# Patient Record
Sex: Male | Born: 1960
Health system: Southern US, Community
[De-identification: ages and names within clinical notes are randomized; demographics above are authoritative.]

## PROBLEM LIST (undated history)

## (undated) DIAGNOSIS — N200 Calculus of kidney: Secondary | ICD-10-CM

## (undated) DIAGNOSIS — K219 Gastro-esophageal reflux disease without esophagitis: Secondary | ICD-10-CM

## (undated) DIAGNOSIS — C61 Malignant neoplasm of prostate: Secondary | ICD-10-CM

## (undated) DIAGNOSIS — R351 Nocturia: Secondary | ICD-10-CM

## (undated) DIAGNOSIS — Z87442 Personal history of urinary calculi: Secondary | ICD-10-CM

## (undated) DIAGNOSIS — Z973 Presence of spectacles and contact lenses: Secondary | ICD-10-CM

## (undated) DIAGNOSIS — R32 Unspecified urinary incontinence: Secondary | ICD-10-CM

## (undated) HISTORY — PX: SHOULDER ARTHROSCOPY: SHX128

## (undated) HISTORY — PX: EXTRACORPOREAL SHOCK WAVE LITHOTRIPSY: SHX1557

---

## 2011-04-19 ENCOUNTER — Emergency Department
Admit: 2011-04-19 | Discharge: 2011-04-19 | Disposition: A | Discharge status: Home / Self Care | Payer: BC Managed Care – PPO | Attending: Emergency Medicine | Admitting: Emergency Medicine

## 2011-04-19 ENCOUNTER — Emergency Department (INDEPENDENT_AMBULATORY_CARE_PROVIDER_SITE_OTHER)
Admission: EM | Admit: 2011-04-19 | Discharge: 2011-04-19 | Disposition: A | Discharge status: Home Health | Payer: BC Managed Care – PPO | Source: Home / Self Care | Attending: Emergency Medicine | Admitting: Emergency Medicine

## 2011-04-19 ENCOUNTER — Encounter: Payer: Self-pay | Admitting: Emergency Medicine

## 2011-04-19 DIAGNOSIS — M545 Low back pain, unspecified: Secondary | ICD-10-CM

## 2011-04-19 DIAGNOSIS — N200 Calculus of kidney: Secondary | ICD-10-CM

## 2011-04-19 LAB — POCT URINALYSIS DIP (MANUAL ENTRY)
Nitrite, UA: NEGATIVE
Protein Ur, POC: 30
pH, UA: 6 (ref 5–8)

## 2011-04-19 MED ORDER — CIPROFLOXACIN HCL 500 MG PO TABS: 500.0000 mg | ORAL_TABLET | Freq: Two times a day (BID) | ORAL | Status: DC

## 2011-04-19 NOTE — ED Notes (Signed)
Kidney stone x 1 week

## 2011-04-19 NOTE — ED Provider Notes (Addendum)
History     CSN: [id number]  Arrival date & time 04/19/11  4098   None     Chief Complaint  Patient presents with  . Nephrolithiasis    (Consider location/radiation/quality/duration/timing/severity/associated sxs/prior treatment) HPI Adam Ramos is a 51 y.o. male who presents today with abd pain / back pain symptoms for 1 week, intermittant. He has a history of kidney stones for the last one was more than 10 years ago. In the past he has had acoustic lithotripsy as well as a removal of stones. His symptoms started about a week ago but he woke up this morning with much more severe pain. It is mostly located on the right side and feels sharp, 8 /10 and similar to the pain that he's had in the past for previous stones.  Nausea and the feeling of being uncomfortable. No dysuria No frequency No urgency No hematuria No penile discharge No fever/chills No fatigue    History reviewed. No pertinent past medical history.  Past Surgical History  Procedure Date  . Kidney stones removed   . Sholder surgery     No family history on file. No family history of kidney stones or other significant abdominal issues.  History  Substance Use Topics  . Smoking status: Never Smoker   . Smokeless tobacco: Not on file  . Alcohol Use: No      Review of Systems  All other systems reviewed and are negative.    Allergies  Review of patient's allergies indicates no known allergies.  Home Medications   Current Outpatient Rx  Name Route Sig Dispense Refill  . CIPROFLOXACIN HCL 500 MG PO TABS Oral Take 1 tablet (500 mg total) by mouth 2 (two) times daily. 10 tablet 0    BP 129/87  Pulse 59  Temp(Src) 98 F (36.7 C) (Oral)  Resp 16  Ht 6\' 1"  (1.854 m)  Wt 235 lb (106.595 kg)  BMI 31.00 kg/m2  Physical Exam  Nursing note and vitals reviewed. Constitutional: He is oriented to person, place, and time. He appears well-developed and well-nourished.  Non-toxic appearance. He does not have a  sickly appearance. He does not appear ill. He appears distressed (appears uncomfortable when sitting down).  HENT:  Head: Normocephalic and atraumatic.  Eyes: No scleral icterus.  Neck: Neck supple.  Cardiovascular: Normal rate, regular rhythm and normal heart sounds.   Pulmonary/Chest: Effort normal and breath sounds normal. No respiratory distress. He has no decreased breath sounds. He has no wheezes. He has no rhonchi.  Abdominal: Soft. Normal appearance. There is no tenderness. There is CVA tenderness (Right-sided). There is no rigidity, no rebound, no guarding, no tenderness at McBurney's point and negative Murphy's sign.  Neurological: He is alert and oriented to person, place, and time. GCS eye subscore is 4. GCS verbal subscore is 5. GCS motor subscore is 6.       Distal neuro status intact in terms of normal distal sensation and strength.  Normal gait.  Skin: Skin is warm and dry. No rash noted.  Psychiatric: He has a normal mood and affect. His speech is normal. He is agitated (secondary to pain).    ED Course  Procedures (including critical care time)   Labs Reviewed  POCT UA - MICROSCOPIC ONLY  POCT URINALYSIS DIP (MANUAL ENTRY)  URINE CULTURE   Ct Abdomen Pelvis Wo Contrast  04/19/2011  *RADIOLOGY REPORT*  Clinical Data: Right-sided back pain, history of kidney stones, prior lithotripsy  CT ABDOMEN AND PELVIS WITHOUT CONTRAST  Technique:  Multidetector CT imaging of the abdomen and pelvis was performed following the standard protocol without intravenous contrast.  Comparison: None.  Findings: Mild subpleural reticulation/atelectasis at the lung bases.  Unenhanced liver, spleen, pancreas, and adrenal glands within normal limits.  Gallbladder is unremarkable.  No intrahepatic or extrahepatic ductal dilatation.  3 mm nonobstructing left upper pole calculus (series 2/image 28). No right renal calculi.  Mild right hydronephrosis.  No evidence of bowel obstruction.  Normal appendix.  No  evidence of abdominal aortic aneurysm.  No abdominopelvic ascites.  Mild stranding in the jejunal mesentery, nonspecific.  No suspicious abdominopelvic lymphadenopathy.  Mild proximal right ureteronephrosis with a 6 mm mid ureteral calculus (series 2/image 48).  No left ureteral or bladder calculi.  Prostate is unremarkable.  Mild degenerative changes of the visualized thoracolumbar spine.  IMPRESSION: 6 mm mid right ureteral calculus with mild proximal hydroureteronephrosis.  3 mm nonobstructing left upper pole calculus.  No left hydronephrosis.  Original Report Authenticated By: Charline Bills, M.D.     1. Pain in lower back   2. Kidney stone       MDM   A urinalysis and urine culture were performed which demonstrates moderate blood, some leukocyte esterase and protein.  A urine culture is sent to the lab.  Due to the patient's symptoms and examination, I feel that he likely has a kidney stone probably on the right side. Therefore we will obtain an CT of the abdomen and pelvis without contrast.  It is performed by [facility] here in clinic and it is read by radiology as above.  I let him know that the 3 mm nonobstructing one on the left side is likely causing no problems. However the 6 mm one on the right side is the cause of his pain and maybe partially obstructing and because of this I would like him to followup with a urologist today.  I was planning on giving him prescription for Cipro and Vicodin, however we get him set up for a urology appointment in about an hour and half and so I will defer to the urologist for further treatment of his kidney stone. I've advised the patient of these plans and he has agreed and will drive to [facility] to followup now with the urologist. If pain becomes unbearable in the next hour or 2, he was advised to go to the urologist earlier as per their recommendations.    Marlaine Hind, MD 04/19/11 1057     Marlaine Hind, MD 04/24/11  1706  Marlaine Hind, MD 04/24/11 [phone]

## 2011-04-21 LAB — URINE CULTURE
Colony Count: NO GROWTH
Organism ID, Bacteria: NO GROWTH

## 2011-05-03 ENCOUNTER — Other Ambulatory Visit: Payer: Self-pay | Admitting: Urology

## 2011-05-08 ENCOUNTER — Encounter (HOSPITAL_COMMUNITY): Payer: Self-pay | Admitting: Pharmacy Technician

## 2011-05-13 ENCOUNTER — Encounter (HOSPITAL_COMMUNITY): Payer: Self-pay | Admitting: *Deleted

## 2011-05-13 NOTE — Progress Notes (Signed)
Reminded no aspirin products 3 days prior to ESWL and to take laxative 4/7/13pm

## 2011-05-19 MED ORDER — CIPROFLOXACIN HCL 500 MG PO TABS
500.0000 mg | ORAL_TABLET | ORAL | Status: AC
  Administered 2011-05-20: 500 mg via ORAL
  Filled 2011-05-19: qty 1

## 2011-05-20 ENCOUNTER — Ambulatory Visit (HOSPITAL_COMMUNITY): Payer: BC Managed Care – PPO

## 2011-05-20 ENCOUNTER — Encounter (HOSPITAL_COMMUNITY)
Admission: RE | Disposition: A | Discharge status: Home / Self Care | Payer: Self-pay | Source: Ambulatory Visit | Attending: Urology

## 2011-05-20 ENCOUNTER — Encounter (HOSPITAL_COMMUNITY): Payer: Self-pay | Admitting: *Deleted

## 2011-05-20 ENCOUNTER — Ambulatory Visit (HOSPITAL_COMMUNITY)
Admission: RE | Admit: 2011-05-20 | Discharge: 2011-05-20 | Disposition: A | Discharge status: Home / Self Care | Payer: BC Managed Care – PPO | Source: Ambulatory Visit | Attending: Urology | Admitting: Urology

## 2011-05-20 DIAGNOSIS — N201 Calculus of ureter: Secondary | ICD-10-CM

## 2011-05-20 SURGERY — LITHOTRIPSY, ESWL
Anesthesia: LOCAL | Laterality: Right

## 2011-05-20 MED ORDER — DEXTROSE-NACL 5-0.45 % IV SOLN
INTRAVENOUS | Status: DC
  Administered 2011-05-20: 1000 mL via INTRAVENOUS

## 2011-05-20 MED ORDER — DIPHENHYDRAMINE HCL 25 MG PO CAPS
ORAL_CAPSULE | ORAL | Status: AC
  Filled 2011-05-20: qty 1

## 2011-05-20 MED ORDER — CIPROFLOXACIN HCL 500 MG PO TABS
ORAL_TABLET | ORAL | Status: AC
  Filled 2011-05-20: qty 1

## 2011-05-20 MED ORDER — DIAZEPAM 5 MG PO TABS
10.0000 mg | ORAL_TABLET | ORAL | Status: AC
  Administered 2011-05-20: 10 mg via ORAL

## 2011-05-20 MED ORDER — DIPHENHYDRAMINE HCL 25 MG PO CAPS
25.0000 mg | ORAL_CAPSULE | ORAL | Status: AC
  Administered 2011-05-20: 25 mg via ORAL

## 2011-05-20 MED ORDER — DIAZEPAM 5 MG PO TABS
ORAL_TABLET | ORAL | Status: AC
  Filled 2011-05-20: qty 2

## 2011-05-20 NOTE — Discharge Instructions (Signed)
DISCHARGE INSTRUCTIONS FOR SHOCKWAVE LITHOTRIPSY ° °MEDICATIONS:  ° °1. DO NOT RESUME YOUR ASPIRIN, or any other medicines like ibuprofen, motrin, excedrin, advil, aleve, vitamin E, fish oil as these can all cause bleeding x 7 days. ° °2. Resume all your other meds from. ° °ACTIVITY °1. No strenuous activity x 1week °2. No driving while on narcotic pain medications °3. Drink plenty of water °4. Continue to walk at home - you can still get blood clots when you are at home, so keep active, but don't over do it. °5. May return to work in 1-3 days. ° °BATHING °You can shower or take a bath. ° ° °SIGNS/SYMPTOMS TO CALL: °1. Please call us if you have a fever greater than 101.5, uncontrolled  °nausea/vomiting, uncontrolled pain, dizziness, unable to urinate, chest pain, shortness of breath, leg swelling, leg pain, redness around wound, drainage from wound, or any other concerns or questions. ° °You can reach us at [phone]. ° ° ° °

## 2011-05-20 NOTE — Brief Op Note (Signed)
05/20/2011  4:16 PM  PATIENT:  Luz C [place]  51 y.o. male  PRE-OPERATIVE DIAGNOSIS:  Right Ureter Stone  POST-OPERATIVE DIAGNOSIS: Right ureter stone  PROCEDURE:  Procedure(s) (LRB): EXTRACORPOREAL SHOCK WAVE LITHOTRIPSY (ESWL) (Right)  SURGEON:  Surgeon(s) and Role:    * Milford Cage, MD - Primary  PHYSICIAN ASSISTANT:   ASSISTANTS: none   ANESTHESIA:   IV sedation  EBL:     BLOOD ADMINISTERED:none  DRAINS: none   LOCAL MEDICATIONS USED:  NONE  SPECIMEN:  No Specimen  DISPOSITION OF SPECIMEN:  N/A  COUNTS:  YES  TOURNIQUET:  * No tourniquets in log *  DICTATION: .Note written in paper chart  PLAN OF CARE: Discharge to home after PACU  PATIENT DISPOSITION:  PACU - hemodynamically stable.   Delay start of Pharmacological VTE agent (>24hrs) due to surgical blood loss or risk of bleeding: yes

## 2011-05-20 NOTE — H&P (Signed)
Urology History and Physical Exam  CC: Right ureter stone.  HPI:       51 year old male presents for SWL of his right ureter stone. Patient presented with a right ureter stone measuring 6 mm. He failed a trial of passage. KUB 05/02/11 shows the stone is located at the level of L4. We have discussed risks, benefits, alternatives, and likelihood of achieving his goals. His stone has moved more distally today over the sacrum, but it is still able to be visualized on fluoroscopy today.    PMH: GERD  PSH: Past Surgical History  Procedure Date  . Kidney stones removed   . Sholder surgery     Allergies: No Known Allergies  Medications: No prescriptions prior to admission  Aspirin 81 mg Dilaudid Ibuprofen Tamsulosin  Social History: History   Social History  . Marital Status: Single    Spouse Name: N/A    Number of Children: N/A  . Years of Education: N/A   Occupational History  . Not on file.   Social History Main Topics  . Smoking status: Never Smoker   . Smokeless tobacco: Not on file  . Alcohol Use: Yes     rarely  . Drug Use: No  . Sexually Active:    Other Topics Concern  . Not on file   Social History Narrative  . No narrative on file    Family History: No family history on file.  Review of Systems: Positive: Gross hematuria. Negative: Chest pain, SOB, fever.  A further 10 point review of systems was negative except what is listed in the HPI.  Physical Exam:  General: No acute distress.  Awake. Head:  Normocephalic.  Atraumatic. ENT:  EOMI.  Mucous membranes moist Neck:  Supple.  No lymphadenopathy. CV:  S1 present. S2 present. Regular rate. Pulmonary: Equal effort bilaterally.  Clear to auscultation bilaterally. Abdomen: Soft.  Non- tender to palpation. Skin:  Normal turgor.  No visible rash. Extremity: No gross deformity of bilateral upper extremities.  No gross deformity of    bilateral lower extremities. Neurologic: Alert. Appropriate  mood.   Studies:  No results found for this basename: HGB:2,WBC:2,PLT:2 in the last 72 hours  No results found for this basename: NA:2,K:2,CL:2,CO2:2,BUN:2,CREATININE:2,CALCIUM:2,MAGNESIUM:2,GFRNONAA:2,GFRAA:2 in the last 72 hours   No results found for this basename: PT:2,INR:2,APTT:2 in the last 72 hours   No components found with this basename: ABG:2    Assessment:  Right ureter stone.  Plan: Shockwave lithotripsy of right ureter stone.

## 2014-02-10 ENCOUNTER — Other Ambulatory Visit: Payer: Self-pay | Admitting: Urology

## 2014-02-14 ENCOUNTER — Encounter (HOSPITAL_COMMUNITY): Payer: Self-pay | Admitting: *Deleted

## 2014-02-17 ENCOUNTER — Ambulatory Visit (HOSPITAL_COMMUNITY)
Admission: RE | Admit: 2014-02-17 | Discharge: 2014-02-17 | Disposition: A | Discharge status: Home / Self Care | Payer: BLUE CROSS/BLUE SHIELD | Source: Ambulatory Visit | Attending: Urology | Admitting: Urology

## 2014-02-17 ENCOUNTER — Encounter (HOSPITAL_COMMUNITY): Payer: Self-pay | Admitting: General Practice

## 2014-02-17 ENCOUNTER — Encounter (HOSPITAL_COMMUNITY)
Admission: RE | Disposition: A | Discharge status: Home / Self Care | Payer: Self-pay | Source: Ambulatory Visit | Attending: Urology

## 2014-02-17 ENCOUNTER — Ambulatory Visit (HOSPITAL_COMMUNITY): Payer: BLUE CROSS/BLUE SHIELD

## 2014-02-17 DIAGNOSIS — N201 Calculus of ureter: Secondary | ICD-10-CM | POA: Insufficient documentation

## 2014-02-17 DIAGNOSIS — Z87891 Personal history of nicotine dependence: Secondary | ICD-10-CM | POA: Diagnosis not present

## 2014-02-17 HISTORY — DX: Gastro-esophageal reflux disease without esophagitis: K21.9

## 2014-02-17 SURGERY — LITHOTRIPSY, ESWL
Anesthesia: LOCAL | Laterality: Left

## 2014-02-17 MED ORDER — DIPHENHYDRAMINE HCL 25 MG PO CAPS
25.0000 mg | ORAL_CAPSULE | ORAL | Status: AC
  Administered 2014-02-17: 25 mg via ORAL
  Filled 2014-02-17: qty 1

## 2014-02-17 MED ORDER — SODIUM CHLORIDE 0.9 % IV SOLN
INTRAVENOUS | Status: DC
  Administered 2014-02-17: 15:00:00 via INTRAVENOUS

## 2014-02-17 MED ORDER — CIPROFLOXACIN HCL 500 MG PO TABS
500.0000 mg | ORAL_TABLET | ORAL | Status: AC
  Administered 2014-02-17: 500 mg via ORAL
  Filled 2014-02-17: qty 1

## 2014-02-17 MED ORDER — DIAZEPAM 5 MG PO TABS
10.0000 mg | ORAL_TABLET | ORAL | Status: AC
  Administered 2014-02-17: 10 mg via ORAL
  Filled 2014-02-17: qty 2

## 2014-02-17 NOTE — H&P (Signed)
Reason For Visit Left ureteral stone   History of Present Illness 36M with extensive history of stones that presented yesterday with 5 days of left renal colic symptoms. KUB revealed a 15mm wide x 92mm long stone in the left proximal ureter. He presents to discuss treatment options. The patient does state that he has bilateral pain, his right side hurts posterior iliac wing which is tender to palpation. He also feels pressure in his left kidney.   Past Medical History Problems  1. History of esophageal reflux (Z87.19)  Surgical History Problems  1. History of Bladder Cystotomy With Basket Extraction Of Calculus 2. History of Lithotripsy 3. History of Lithotripsy 4. History of Shoulder Surgery Right  Current Meds 1. Hydrocodone-Acetaminophen 7.5-325 MG Oral Tablet; TAKE 1 TO 2 TABLETS EVERY 4  TO 6 HOURS AS NEEDED FOR PAIN;  Therapy: [id number] to (Evaluate:03Jan2016); Last Rx:30Dec2015 Ordered 2. Ibuprofen 200 MG Oral Tablet;  Therapy: (Recorded:08Mar2013) to Recorded 3. Promethazine HCl - 25 MG Oral Tablet; May take 0.5 to 1 tablet Q 6hrs nausea/vomiting;  Therapy: [id number] to (Last Rx:30Dec2015)  Requested for: 30Dec2015 Ordered 4. Tamsulosin HCl - 0.4 MG Oral Capsule; TAKE 1 CAPSULE Daily;  Therapy: [id number] to (Evaluate:29Jan2016)  Requested for: 85OYD7412; Last  Rx:30Dec2015 Ordered  Allergies Medication  1. No Known Drug Allergies  Family History Problems  1. Family history of Family Health Status Of Mother - Alive 2. Family history of Stroke Syndrome : Father  Social History Problems  1. Alcohol Use   occasional use 2. Caffeine Use   3-4 3. Former smoker [phone])   quit 30+ years ago 4. Marital History - Single 5. Occupation:   draftsman  Review of Systems No changes in pts bowel habits, neurological changes, or progressive lower urinary tract symptoms.    Vitals Vital Signs [Data Includes: Last 1 Day]  Recorded: 31Dec2015 11:58AM  Blood Pressure:  136 / 87 Temperature: 98 F Heart Rate: 60 Recorded: 30Dec2015 08:44AM  Weight: 230 lb  BMI Calculated: 30.35 BSA Calculated: 2.28 Blood Pressure: 143 / 87 Temperature: 98.2 F Heart Rate: 56  Physical Exam Constitutional: Well nourished and well developed . No acute distress.  Pulmonary: No respiratory distress, normal respiratory rhythm and effort and clear bilateral breath sounds.  Cardiovascular: Heart rate and rhythm are normal . No peripheral edema.  Abdomen: No right CVA tenderness and no left CVA tenderness.    Results/Data Urine [Data Includes: Last 1 Day]   [id number]  COLOR YELLOW   APPEARANCE CLEAR   SPECIFIC GRAVITY <1.005   pH 6.0   GLUCOSE NEG mg/dL  BILIRUBIN NEG   KETONE NEG mg/dL  BLOOD MOD   PROTEIN NEG mg/dL  UROBILINOGEN 0.2 mg/dL  NITRITE NEG   LEUKOCYTE ESTERASE NEG   SQUAMOUS EPITHELIAL/HPF FEW   WBC 0-2 WBC/hpf  RBC 0-2 RBC/hpf  BACTERIA NONE SEEN   CRYSTALS NONE SEEN   CASTS NONE SEEN    Urinalysis demonstrates moderate blood without evidence of microscopic hematuria. No evidence of infection.  I reviewed the patient's KUB which reveals a large calcification within the left proximal ureter. However there is area on the expected to direction of the right ureter is obscured by bowel gas but could have a stone within it. Further, the patient is having bilateral pain. As such, I felt it necessary to get a CT scan, stone protocol.   Assessment Assessed  1. Left ureteral calculus (N20.1)  Patient has a large proximal left ureteral obstructing stone.   Plan Health  Maintenance  1. UA With REFLEX; [Do Not Release]; Status:Complete;   Done: [id number] 11:47AM Nephrolithiasis  2. AU CT-STONE PROTOCOL; Status:In Progress - Specimen/Data Collected;   Done:  36OQH4765 12:00AM  Discussion/Summary We went over the treatment options for proximal ureteral stones including shockwave lithotripsy ureteroscopy, and observation. The stone is quite large and  would not likely pass in a reasonable time frame necessitated not recommended medical expulsion therapy. The patient has had shockwave lithotripsy before and prefers this modality. I did discuss with him ureteroscopy and stent placement as well. Finally I went over the risks and benefits of shockwave lithotripsy including need for additional procedures, perinephric hematoma, and pain. The patient has agreed to proceed.

## 2014-02-17 NOTE — Discharge Instructions (Signed)
See [facility] discharge instructions in chart.  

## 2014-02-17 NOTE — Op Note (Signed)
See [facility] OP note scanned into chart. 

## 2016-07-10 DIAGNOSIS — Z125 Encounter for screening for malignant neoplasm of prostate: Secondary | ICD-10-CM | POA: Diagnosis not present

## 2016-07-10 DIAGNOSIS — Z131 Encounter for screening for diabetes mellitus: Secondary | ICD-10-CM | POA: Diagnosis not present

## 2016-07-10 DIAGNOSIS — Z0000 Encounter for general adult medical examination without abnormal findings: Secondary | ICD-10-CM | POA: Diagnosis not present

## 2016-07-10 DIAGNOSIS — Z1211 Encounter for screening for malignant neoplasm of colon: Secondary | ICD-10-CM | POA: Diagnosis not present

## 2016-08-15 DIAGNOSIS — Z1211 Encounter for screening for malignant neoplasm of colon: Secondary | ICD-10-CM | POA: Diagnosis not present

## 2016-08-26 ENCOUNTER — Other Ambulatory Visit: Payer: Self-pay | Admitting: Urology

## 2016-08-26 DIAGNOSIS — R109 Unspecified abdominal pain: Secondary | ICD-10-CM | POA: Diagnosis not present

## 2016-08-26 DIAGNOSIS — R1111 Vomiting without nausea: Secondary | ICD-10-CM | POA: Diagnosis not present

## 2016-08-26 DIAGNOSIS — R8271 Bacteriuria: Secondary | ICD-10-CM | POA: Diagnosis not present

## 2016-08-26 DIAGNOSIS — N202 Calculus of kidney with calculus of ureter: Secondary | ICD-10-CM | POA: Diagnosis not present

## 2016-08-28 ENCOUNTER — Encounter (HOSPITAL_COMMUNITY): Payer: Self-pay | Admitting: *Deleted

## 2016-09-02 ENCOUNTER — Encounter (HOSPITAL_COMMUNITY): Payer: Self-pay | Admitting: General Practice

## 2016-09-02 ENCOUNTER — Ambulatory Visit (HOSPITAL_COMMUNITY)
Admission: RE | Admit: 2016-09-02 | Discharge: 2016-09-02 | Disposition: A | Discharge status: Home / Self Care | Payer: BLUE CROSS/BLUE SHIELD | Source: Ambulatory Visit | Attending: Urology | Admitting: Urology

## 2016-09-02 ENCOUNTER — Encounter (HOSPITAL_COMMUNITY)
Admission: RE | Disposition: A | Discharge status: Home / Self Care | Payer: Self-pay | Source: Ambulatory Visit | Attending: Urology

## 2016-09-02 ENCOUNTER — Ambulatory Visit (HOSPITAL_COMMUNITY): Payer: BLUE CROSS/BLUE SHIELD

## 2016-09-02 DIAGNOSIS — N132 Hydronephrosis with renal and ureteral calculous obstruction: Secondary | ICD-10-CM | POA: Diagnosis not present

## 2016-09-02 DIAGNOSIS — Z7982 Long term (current) use of aspirin: Secondary | ICD-10-CM | POA: Insufficient documentation

## 2016-09-02 DIAGNOSIS — N200 Calculus of kidney: Secondary | ICD-10-CM | POA: Diagnosis present

## 2016-09-02 DIAGNOSIS — Z01818 Encounter for other preprocedural examination: Secondary | ICD-10-CM | POA: Diagnosis not present

## 2016-09-02 DIAGNOSIS — N201 Calculus of ureter: Secondary | ICD-10-CM | POA: Diagnosis not present

## 2016-09-02 DIAGNOSIS — Z791 Long term (current) use of non-steroidal anti-inflammatories (NSAID): Secondary | ICD-10-CM | POA: Diagnosis not present

## 2016-09-02 DIAGNOSIS — Z87891 Personal history of nicotine dependence: Secondary | ICD-10-CM | POA: Insufficient documentation

## 2016-09-02 HISTORY — PX: EXTRACORPOREAL SHOCK WAVE LITHOTRIPSY: SHX1557

## 2016-09-02 HISTORY — DX: Personal history of urinary calculi: Z87.442

## 2016-09-02 SURGERY — LITHOTRIPSY, ESWL
Anesthesia: LOCAL | Laterality: Right

## 2016-09-02 MED ORDER — DIAZEPAM 5 MG PO TABS
10.0000 mg | ORAL_TABLET | ORAL | Status: AC
  Administered 2016-09-02: 10 mg via ORAL
  Filled 2016-09-02: qty 2

## 2016-09-02 MED ORDER — CIPROFLOXACIN HCL 500 MG PO TABS
500.0000 mg | ORAL_TABLET | ORAL | Status: AC
  Administered 2016-09-02: 500 mg via ORAL
  Filled 2016-09-02: qty 1

## 2016-09-02 MED ORDER — SODIUM CHLORIDE 0.9 % IV SOLN
INTRAVENOUS | Status: DC
  Administered 2016-09-02: 08:00:00 via INTRAVENOUS

## 2016-09-02 MED ORDER — DIPHENHYDRAMINE HCL 25 MG PO CAPS
25.0000 mg | ORAL_CAPSULE | ORAL | Status: AC
  Administered 2016-09-02: 25 mg via ORAL
  Filled 2016-09-02: qty 1

## 2016-09-02 NOTE — H&P (Signed)
CC: I have kidney stones.  HPI: Adam Ramos is a 56 year-old male established patient who is here for renal calculi.  First noted symptoms on July 5. Complaining of worsening colic pain radiating from the right lower back through the flank and right lower quadrant of the abdomen. Denies any changes in lower urinary tract symptoms. Also associated with nausea. Afebrile. Patient has a past urological history significant for kidney stones last seen by myself in 2017. Denies any interval stone passage.   The problem is on the right side. He first stated noticing pain on 08/15/2016. This is not his first kidney stone. He is currently having flank pain, back pain, and nausea. He denies having groin pain, vomiting, fever, and chills. He has not caught a stone in his urine strainer since his symptoms began.   He has had eswl for treatment of his stones in the past.     ALLERGIES: No Allergies    MEDICATIONS: Aspirin Ec 81 mg tablet, delayed release  Ibuprofen 200 MG Oral Tablet Oral     GU PSH: ESWL - 2016, 2013, 2013      [place] Notes: Lithotripsy, Lithotripsy, Bladder Cystotomy With Basket Extraction Of Calculus, Shoulder Surgery Right, Lithotripsy   NON-GU PSH: Remove Ureter Calculus - 2013    GU PMH: Low back pain, Lumbago - 05/08/2015 Ureteral calculus, Left ureteral calculus - 2016, Calculus of ureter, - 2015 Abdominal Pain Unspec, Right flank pain - 2015 Other microscopic hematuria, Microscopic hematuria - 2015 Renal calculus, Nephrolithiasis - 2014    NON-GU PMH: Encounter for general adult medical examination without abnormal findings, Encounter for preventive health examination - 2015 Nausea, Nausea - 2015 Hypercalciuria, Hypercalciuria - 2014 Personal history of other diseases of the digestive system, History of esophageal reflux - 2014    FAMILY HISTORY: Family Health Status Of Mother - Alive 19 - Runs In Family Stroke Syndrome - Father   SOCIAL HISTORY: Marital Status:  Single Current Smoking Status: Patient does not smoke anymore. Has not smoked since 02/12/1983.   Tobacco Use Assessment Completed: Used Tobacco in last 30 days? Social Drinker.  Drinks 3 caffeinated drinks per day.     Notes: Former smoker, Occupation:, Caffeine Use, Marital History - Single, Alcohol Use   REVIEW OF SYSTEMS:    GU Review Male:   Patient denies frequent urination, hard to postpone urination, burning/ pain with urination, get up at night to urinate, leakage of urine, stream starts and stops, trouble starting your stream, have to strain to urinate , erection problems, and penile pain.  Gastrointestinal (Upper):   Patient reports nausea and vomiting. Patient denies indigestion/ heartburn.  Gastrointestinal (Lower):   Patient denies diarrhea and constipation.  Constitutional:   Patient denies fever, night sweats, weight loss, and fatigue.  Skin:   Patient denies skin rash/ lesion and itching.  Eyes:   Patient denies double vision and blurred vision.  Ears/ Nose/ Throat:   Patient denies sore throat and sinus problems.  Hematologic/Lymphatic:   Patient denies swollen glands and easy bruising.  Cardiovascular:   Patient denies leg swelling and chest pains.  Respiratory:   Patient denies cough and shortness of breath.  Endocrine:   Patient denies excessive thirst.  Musculoskeletal:   Patient denies back pain and joint pain.  Neurological:   Patient denies headaches and dizziness.  Psychologic:   Patient denies depression and anxiety.   VITAL SIGNS:      08/26/2016 10:14 AM  Weight 235 lb / 106.59 kg  Height 73 in / 185.42 cm  BP 127/76 mmHg  Pulse 68 /min  Temperature 98.5 F / 37 C  BMI 31.0 kg/m   MULTI-SYSTEM PHYSICAL EXAMINATION:    Constitutional: Well-nourished. No physical deformities. Normally developed. Good grooming.  Respiratory: No labored breathing, no use of accessory muscles. CTA.  Cardiovascular: Normal temperature, normal extremity pulses, no swelling, no  varicosities. RRR.  Skin: No paleness, no jaundice, no cyanosis. No lesion, no ulcer, no rash.  Neurologic / Psychiatric: Oriented to time, oriented to place, oriented to person. No depression, no anxiety, no agitation.  Gastrointestinal: No mass, no tenderness, no rigidity, non obese abdomen. Right flank tenderness.  Musculoskeletal: Normal gait and station of head and neck.     PAST DATA REVIEWED:  Source Of History:  Patient  Records Review:   Previous Patient Records  Urine Test Review:   Urinalysis  X-Ray Review: KUB: Reviewed Films.  [facility] Protocol: Reviewed Films.     08/26/16  Urinalysis  Urine Appearance Cloudy   Urine Color Amber   Urine Glucose Neg   Urine Bilirubin Neg   Urine Ketones Trace   Urine Specific Gravity 1.015   Urine Blood 3+   Urine pH 6.0   Urine Protein Trace   Urine Urobilinogen 0.2   Urine Nitrites Neg   Urine Leukocyte Esterase Trace   Urine WBC/hpf 0 - 5/hpf   Urine RBC/hpf 40 - 60/hpf   Urine Epithelial Cells 0 - 5/hpf   Urine Bacteria Rare (0-9/hpf)   Urine Mucous Present   Urine Yeast NS (Not Seen)   Urine Trichomonas Not Present   Urine Cystals Ca Oxalate   Urine Casts NS (Not Seen)   Urine Sperm Not Present    PROCEDURES:         C.T. Urogram - [id number]  Nonobstructing but prominently sized upper and lower renal collecting system calculi on the left. There is an approximately 5.5 mm x 6.5 mm calculi noted within the proximal right ureter with associated dilation of the ureter proximally and hydronephrosis.               KUB - [id number]  A single view of the abdomen is obtained.  Bony Abnormalities:  Stable right sided phlebolith.  Calculi:  Middle pole left kidney calculi. Grossly visualized in the upper-portion of the collecting system. Left calculi size: 41mm x 52mm. Proximal right ureter calculi 7.6 mm.               Urinalysis w/Scope Dipstick Dipstick Cont'd Micro  Color: Amber Bilirubin: Neg WBC/hpf: 0 - 5/hpf   Appearance: Cloudy Ketones: Trace RBC/hpf: 40 - 60/hpf  Specific Gravity: 1.015 Blood: 3+ Bacteria: Rare (0-9/hpf)  pH: 6.0 Protein: Trace Cystals: Ca Oxalate  Glucose: Neg Urobilinogen: 0.2 Casts: NS (Not Seen)    Nitrites: Neg Trichomonas: Not Present    Leukocyte Esterase: Trace Mucous: Present      Epithelial Cells: 0 - 5/hpf      Yeast: NS (Not Seen)      Sperm: Not Present    ASSESSMENT:      ICD-10 Details  1 GU:   Renal and ureteral calculus - N20.2 Right   PLAN:            Medications Refill Meds: Hydrocodone-Acetaminophen 7.5-325 MG Oral Tablet 1 tablet PO Q 6 H PRN   #20  0 Refill(s)  Promethazine HCl - 25 MG Oral Tablet 1 tablet PO Q 6 H PRN   #20  0 Refill(s)  Tamsulosin Hcl 0.4 mg capsule, ext release 24 hr 1 capsule PO Daily   #30  3 Refill(s)            Orders Labs Urine Culture  X-Rays: C.T. Stone Protocol Without Contrast    KUB  X-Ray Notes: . History:  Hematuria: Yes/No  Patient to see MD after exam: Yes/No  Previous exam: CT / IVP/ US/ KUB/ None  When:  Where:  Diabetic: Yes/ No  BUN/ Creatinine:  Date of last BUN Creatinine:  Weight in pounds:  Allergy- IV Contrast: Yes/ No  Conflicting diabetic meds: Yes/ No  Diabetic Meds:  Prior Authorization #: [id number]           Schedule         Document Letter(s):  Created for Patient: Clinical Summary         Notes:   Plan for right ESWL. Will address left nonobstructing stone at a later date.

## 2016-09-04 ENCOUNTER — Encounter (HOSPITAL_COMMUNITY): Payer: Self-pay | Admitting: Urology

## 2016-09-16 DIAGNOSIS — N202 Calculus of kidney with calculus of ureter: Secondary | ICD-10-CM | POA: Diagnosis not present

## 2016-09-21 DIAGNOSIS — R600 Localized edema: Secondary | ICD-10-CM | POA: Diagnosis not present

## 2016-09-21 DIAGNOSIS — L239 Allergic contact dermatitis, unspecified cause: Secondary | ICD-10-CM | POA: Diagnosis not present

## 2016-12-16 DIAGNOSIS — N200 Calculus of kidney: Secondary | ICD-10-CM | POA: Diagnosis not present

## 2017-05-12 DIAGNOSIS — N200 Calculus of kidney: Secondary | ICD-10-CM | POA: Diagnosis not present

## 2017-06-19 DIAGNOSIS — N200 Calculus of kidney: Secondary | ICD-10-CM | POA: Diagnosis not present

## 2017-08-20 DIAGNOSIS — Z125 Encounter for screening for malignant neoplasm of prostate: Secondary | ICD-10-CM | POA: Diagnosis not present

## 2017-08-20 DIAGNOSIS — Z0000 Encounter for general adult medical examination without abnormal findings: Secondary | ICD-10-CM | POA: Diagnosis not present

## 2017-08-20 DIAGNOSIS — Z1159 Encounter for screening for other viral diseases: Secondary | ICD-10-CM | POA: Diagnosis not present

## 2018-01-16 DIAGNOSIS — N200 Calculus of kidney: Secondary | ICD-10-CM | POA: Diagnosis not present

## 2018-01-20 ENCOUNTER — Other Ambulatory Visit: Payer: Self-pay | Admitting: Urology

## 2018-02-05 ENCOUNTER — Other Ambulatory Visit: Payer: Self-pay

## 2018-02-05 ENCOUNTER — Encounter (HOSPITAL_BASED_OUTPATIENT_CLINIC_OR_DEPARTMENT_OTHER): Payer: Self-pay | Admitting: *Deleted

## 2018-02-05 NOTE — Progress Notes (Signed)
Spoke w/ pt via phone for pre-op interview.  Npo after mn.  Arrive at [facility].

## 2018-02-19 ENCOUNTER — Encounter (HOSPITAL_BASED_OUTPATIENT_CLINIC_OR_DEPARTMENT_OTHER): Payer: Self-pay

## 2018-02-19 ENCOUNTER — Ambulatory Visit (HOSPITAL_BASED_OUTPATIENT_CLINIC_OR_DEPARTMENT_OTHER): Payer: BLUE CROSS/BLUE SHIELD | Admitting: Anesthesiology

## 2018-02-19 ENCOUNTER — Encounter (HOSPITAL_BASED_OUTPATIENT_CLINIC_OR_DEPARTMENT_OTHER)
Admission: RE | Disposition: A | Discharge status: Home / Self Care | Payer: Self-pay | Source: Ambulatory Visit | Attending: Urology

## 2018-02-19 ENCOUNTER — Other Ambulatory Visit: Payer: Self-pay

## 2018-02-19 ENCOUNTER — Ambulatory Visit (HOSPITAL_BASED_OUTPATIENT_CLINIC_OR_DEPARTMENT_OTHER)
Admission: RE | Admit: 2018-02-19 | Discharge: 2018-02-19 | Disposition: A | Discharge status: Home / Self Care | Payer: BLUE CROSS/BLUE SHIELD | Source: Ambulatory Visit | Attending: Urology | Admitting: Urology

## 2018-02-19 DIAGNOSIS — Z87891 Personal history of nicotine dependence: Secondary | ICD-10-CM | POA: Diagnosis not present

## 2018-02-19 DIAGNOSIS — Z7982 Long term (current) use of aspirin: Secondary | ICD-10-CM | POA: Diagnosis not present

## 2018-02-19 DIAGNOSIS — N200 Calculus of kidney: Secondary | ICD-10-CM | POA: Diagnosis not present

## 2018-02-19 DIAGNOSIS — Z87442 Personal history of urinary calculi: Secondary | ICD-10-CM | POA: Insufficient documentation

## 2018-02-19 HISTORY — PX: HOLMIUM LASER APPLICATION: SHX5852

## 2018-02-19 HISTORY — PX: CYSTOSCOPY/URETEROSCOPY/HOLMIUM LASER/STENT PLACEMENT: SHX6546

## 2018-02-19 HISTORY — DX: Calculus of kidney: N20.0

## 2018-02-19 HISTORY — DX: Nocturia: R35.1

## 2018-02-19 HISTORY — DX: Presence of spectacles and contact lenses: Z97.3

## 2018-02-19 SURGERY — CYSTOSCOPY/URETEROSCOPY/HOLMIUM LASER/STENT PLACEMENT
Anesthesia: General | Site: Renal | Laterality: Left

## 2018-02-19 MED ORDER — CEFAZOLIN SODIUM-DEXTROSE 2-4 GM/100ML-% IV SOLN
INTRAVENOUS | Status: AC
  Filled 2018-02-19: qty 100

## 2018-02-19 MED ORDER — IOHEXOL 300 MG/ML  SOLN
INTRAMUSCULAR | Status: DC | PRN
  Administered 2018-02-19: 8 mL

## 2018-02-19 MED ORDER — CEFAZOLIN SODIUM-DEXTROSE 2-4 GM/100ML-% IV SOLN
2.0000 g | INTRAVENOUS | Status: AC
  Administered 2018-02-19: 2 g via INTRAVENOUS
  Filled 2018-02-19: qty 100

## 2018-02-19 MED ORDER — DEXAMETHASONE SODIUM PHOSPHATE 4 MG/ML IJ SOLN
INTRAMUSCULAR | Status: DC | PRN
  Administered 2018-02-19: 10 mg via INTRAVENOUS

## 2018-02-19 MED ORDER — HYDROMORPHONE HCL 1 MG/ML IJ SOLN
0.2500 mg | INTRAMUSCULAR | Status: DC | PRN
  Administered 2018-02-19: 0.5 mg via INTRAVENOUS
  Administered 2018-02-19: 0.25 mg via INTRAVENOUS
  Filled 2018-02-19: qty 0.5

## 2018-02-19 MED ORDER — BELLADONNA ALKALOIDS-OPIUM 16.2-60 MG RE SUPP
RECTAL | Status: AC
  Filled 2018-02-19: qty 1

## 2018-02-19 MED ORDER — GLYCOPYRROLATE 0.2 MG/ML IJ SOLN
0.2000 mg | Freq: Once | INTRAMUSCULAR | Status: AC
  Administered 2018-02-19: 0.2 mg via INTRAVENOUS
  Filled 2018-02-19: qty 1

## 2018-02-19 MED ORDER — GLYCOPYRROLATE 0.2 MG/ML IJ SOLN
0.2000 mg | Freq: Once | INTRAMUSCULAR | Status: DC
  Filled 2018-02-19: qty 1

## 2018-02-19 MED ORDER — SUGAMMADEX SODIUM 200 MG/2ML IV SOLN
INTRAVENOUS | Status: AC
  Filled 2018-02-19: qty 2

## 2018-02-19 MED ORDER — FENTANYL CITRATE (PF) 100 MCG/2ML IJ SOLN
INTRAMUSCULAR | Status: DC | PRN
  Administered 2018-02-19 (×4): 25 ug via INTRAVENOUS

## 2018-02-19 MED ORDER — MIDAZOLAM HCL 2 MG/2ML IJ SOLN
INTRAMUSCULAR | Status: AC
  Filled 2018-02-19: qty 2

## 2018-02-19 MED ORDER — LACTATED RINGERS IV SOLN
INTRAVENOUS | Status: DC
  Administered 2018-02-19 (×2): via INTRAVENOUS
  Filled 2018-02-19: qty 1000

## 2018-02-19 MED ORDER — LIDOCAINE HCL URETHRAL/MUCOSAL 2 % EX GEL
CUTANEOUS | Status: AC
  Filled 2018-02-19: qty 5

## 2018-02-19 MED ORDER — MIDAZOLAM HCL 5 MG/5ML IJ SOLN
INTRAMUSCULAR | Status: DC | PRN
  Administered 2018-02-19: 2 mg via INTRAVENOUS

## 2018-02-19 MED ORDER — GLYCOPYRROLATE PF 0.2 MG/ML IJ SOSY
PREFILLED_SYRINGE | INTRAMUSCULAR | Status: AC
  Filled 2018-02-19: qty 2

## 2018-02-19 MED ORDER — PROMETHAZINE HCL 25 MG/ML IJ SOLN
INTRAMUSCULAR | Status: AC
  Filled 2018-02-19: qty 1

## 2018-02-19 MED ORDER — TRAMADOL HCL 50 MG PO TABS: 50.0000 mg | ORAL_TABLET | Freq: Four times a day (QID) | ORAL | 0 refills | Status: DC | PRN

## 2018-02-19 MED ORDER — DEXAMETHASONE SODIUM PHOSPHATE 10 MG/ML IJ SOLN
INTRAMUSCULAR | Status: AC
  Filled 2018-02-19: qty 1

## 2018-02-19 MED ORDER — PROMETHAZINE HCL 25 MG/ML IJ SOLN
6.2500 mg | INTRAMUSCULAR | Status: DC | PRN
  Filled 2018-02-19: qty 1

## 2018-02-19 MED ORDER — PHENAZOPYRIDINE HCL 100 MG PO TABS
ORAL_TABLET | ORAL | Status: AC
  Filled 2018-02-19: qty 2

## 2018-02-19 MED ORDER — BELLADONNA ALKALOIDS-OPIUM 16.2-60 MG RE SUPP
RECTAL | Status: DC | PRN
  Administered 2018-02-19: 1 via RECTAL

## 2018-02-19 MED ORDER — ONDANSETRON HCL 4 MG/2ML IJ SOLN
INTRAMUSCULAR | Status: DC | PRN
  Administered 2018-02-19: 4 mg via INTRAVENOUS

## 2018-02-19 MED ORDER — LIDOCAINE HCL URETHRAL/MUCOSAL 2 % EX GEL
CUTANEOUS | Status: DC | PRN
  Administered 2018-02-19: 1 via URETHRAL

## 2018-02-19 MED ORDER — PHENAZOPYRIDINE HCL 200 MG PO TABS: 200.0000 mg | ORAL_TABLET | Freq: Three times a day (TID) | ORAL | 0 refills | Status: DC | PRN

## 2018-02-19 MED ORDER — GLYCOPYRROLATE 0.2 MG/ML IJ SOLN
0.1000 mg | Freq: Once | INTRAMUSCULAR | Status: DC | PRN
  Filled 2018-02-19: qty 0.5

## 2018-02-19 MED ORDER — OXYCODONE HCL 5 MG/5ML PO SOLN
5.0000 mg | Freq: Once | ORAL | Status: DC | PRN
  Filled 2018-02-19: qty 5

## 2018-02-19 MED ORDER — PHENYLEPHRINE HCL 10 MG/ML IJ SOLN
INTRAMUSCULAR | Status: AC
  Filled 2018-02-19: qty 1

## 2018-02-19 MED ORDER — PROMETHAZINE HCL 25 MG/ML IJ SOLN
6.2500 mg | Freq: Once | INTRAMUSCULAR | Status: DC
  Filled 2018-02-19: qty 1

## 2018-02-19 MED ORDER — HYDROMORPHONE HCL 1 MG/ML IJ SOLN
INTRAMUSCULAR | Status: AC
  Filled 2018-02-19: qty 1

## 2018-02-19 MED ORDER — PHENAZOPYRIDINE HCL 200 MG PO TABS
200.0000 mg | ORAL_TABLET | Freq: Once | ORAL | Status: AC
  Administered 2018-02-19: 200 mg via ORAL
  Filled 2018-02-19: qty 1

## 2018-02-19 MED ORDER — OXYCODONE HCL 5 MG PO TABS
5.0000 mg | ORAL_TABLET | Freq: Once | ORAL | Status: DC | PRN
  Filled 2018-02-19: qty 1

## 2018-02-19 MED ORDER — SODIUM CHLORIDE 0.9 % IR SOLN
Status: DC | PRN
  Administered 2018-02-19: 12000 mL via INTRAVESICAL

## 2018-02-19 MED ORDER — PROPOFOL 10 MG/ML IV BOLUS
INTRAVENOUS | Status: DC | PRN
  Administered 2018-02-19: 200 mg via INTRAVENOUS

## 2018-02-19 MED ORDER — PROPOFOL 10 MG/ML IV BOLUS
INTRAVENOUS | Status: AC
  Filled 2018-02-19: qty 20

## 2018-02-19 MED ORDER — ACETAMINOPHEN 10 MG/ML IV SOLN
INTRAVENOUS | Status: DC | PRN
  Administered 2018-02-19: 1000 mg via INTRAVENOUS

## 2018-02-19 MED ORDER — LIDOCAINE HCL (CARDIAC) PF 100 MG/5ML IV SOSY
PREFILLED_SYRINGE | INTRAVENOUS | Status: DC | PRN
  Administered 2018-02-19: 100 mg via INTRAVENOUS

## 2018-02-19 MED ORDER — ONDANSETRON HCL 4 MG/2ML IJ SOLN
INTRAMUSCULAR | Status: AC
  Filled 2018-02-19: qty 2

## 2018-02-19 MED ORDER — PHENYLEPHRINE 40 MCG/ML (10ML) SYRINGE FOR IV PUSH (FOR BLOOD PRESSURE SUPPORT)
PREFILLED_SYRINGE | INTRAVENOUS | Status: AC
  Filled 2018-02-19: qty 10

## 2018-02-19 MED ORDER — FENTANYL CITRATE (PF) 100 MCG/2ML IJ SOLN
INTRAMUSCULAR | Status: AC
  Filled 2018-02-19: qty 2

## 2018-02-19 SURGICAL SUPPLY — 21 items
BAG DRAIN URO-CYSTO SKYTR STRL (DRAIN) ×2 IMPLANT
BASKET LASER NITINOL 1.9FR (BASKET) IMPLANT
CATH URET 5FR 28IN OPEN ENDED (CATHETERS) ×2 IMPLANT
CATH URET DUAL LUMEN 6-10FR 50 (CATHETERS) IMPLANT
CLOTH BEACON ORANGE TIMEOUT ST (SAFETY) ×2 IMPLANT
EXTRACTOR STONE 1.7FRX115CM (UROLOGICAL SUPPLIES) IMPLANT
FIBER LASER TRAC TIP (UROLOGICAL SUPPLIES) ×1 IMPLANT
GLOVE BIO SURGEON STRL SZ7.5 (GLOVE) ×2 IMPLANT
GOWN STRL REUS W/TWL XL LVL3 (GOWN DISPOSABLE) ×2 IMPLANT
GUIDEWIRE ANG ZIPWIRE 038X150 (WIRE) IMPLANT
GUIDEWIRE STR DUAL SENSOR (WIRE) ×2 IMPLANT
INFUSOR MANOMETER BAG 3000ML (MISCELLANEOUS) IMPLANT
IV NS IRRIG 3000ML ARTHROMATIC (IV SOLUTION) ×4 IMPLANT
KIT TURNOVER CYSTO (KITS) ×2 IMPLANT
MANIFOLD NEPTUNE II (INSTRUMENTS) IMPLANT
NS IRRIG 500ML POUR BTL (IV SOLUTION) ×2 IMPLANT
PACK CYSTO (CUSTOM PROCEDURE TRAY) ×2 IMPLANT
SHEATH URET ACCESS 12FR/35CM (UROLOGICAL SUPPLIES) ×1 IMPLANT
STENT URET 6FRX28 CONTOUR (STENTS) ×1 IMPLANT
TUBE CONNECTING 12X1/4 (SUCTIONS) IMPLANT
TUBING UROLOGY SET (TUBING) ×2 IMPLANT

## 2018-02-19 NOTE — Op Note (Signed)
Preoperative diagnosis:  1. Left nonobstructing stone  Postoperative diagnosis:  1. Same  Procedure: 1. Cystoscopy, left retrograde pyelogram with interpretation 2. Left ureteroscopy, laser lithotripsy 3. Left ureteral stent placement  Surgeon: Ardis Hughs, MD  Anesthesia: General  Complications: None  Intraoperative findings:  #1: The retrograde pyelogram was performed using 10 cc of Omnipaque contrast through a 5 [place] open-ended ureteral catheter.  This demonstrated a large filling defect in the upper pole calyx with marginal filling of the upper pole distal to this.  There are no other significant abnormalities.  The ureter was of normal caliber and there is no hydroureteronephrosis. #2: Using the digital ureteroscope and advancing it up into the proximal ureter I created a false passage and attempt to get beyond the proximal ureter.  I was able to get back into the lumen using the safety wire. #3: The stone was in the upper pole and was dusted.  Behind the stone there were numerous very small stone fragments.  The upper pole was dilated and blunted as if it had been obstructed from the larger stone.  EBL: Minimal  Specimens: None  Indication: Adam Ramos is a 58 y.o. patient with history of kidney stones.  We have been following his left-sided stone burden for several years and it has been notably enlarging.  I recommended treatment given the growth of the stone.  After reviewing the management options for treatment, he elected to proceed with the above surgical procedure(s). We have discussed the potential benefits and risks of the procedure, side effects of the proposed treatment, the likelihood of the patient achieving the goals of the procedure, and any potential problems that might occur during the procedure or recuperation. Informed consent has been obtained.  Description of procedure:  The patient was taken to the operating room and general anesthesia was  induced.  The patient was placed in the dorsal lithotomy position, prepped and draped in the usual sterile fashion, and preoperative antibiotics were administered. A preoperative time-out was performed.   21 French 30 degrees cystoscope was gently passed to the patient's urethra and the bladder under visual guidance.  360 degrees cystoscopic evaluation was performed.  The bladder mucosa was normal in appearance.  The ureters were orthotopic.  There were no stones or mucosal abnormalities.  Using a 5 [place] open-ended ureteral catheter I cannulated the patient's left ureteral orifice and performed retrograde pyelogram with the above findings.  I then advanced a 0.038 sensor wire up through the open-ended catheter and into the left renal pelvis.  I remove the telescope and the catheter over the wire.  I then advanced a dual-lumen catheter over the wire into the proximal ureter.  I then advanced a second wire up into the left renal pelvis.  I remove the catheter over the wire.  I then advanced a medium sized 12/14 French ureteral access sheath over the safety wire and into the mid ureter.  I remove the inner portion of the sheath and the wire.  I then advanced the flexible ureteroscope up into the left ureter.  I met some resistance and with pressure advanced the scope through the ureter.  I did create a false passage at this point but was able to reestablish the lumen with a second wire through the scope following the safety wire.  Once into the renal pelvis I noted a large stone in the upper pole.  There are no other stone fragments in the mid or lower pole.  Using a  200 m laser fiber I dusted the stone with settings of 10 Hz and 1.0 J.  Once the very large stone was fragmented I pushed through into the upper pole and noted several other smaller stone fragments there I laser fragmented these as well.  Visualization became difficult because of the amount of sediment and the bleeding that was created from the laser.   I irrigated several times to get any large clots out and then slowly pulled back with the ureteroscope noting the ureter to be intact, I did reinspect the area of the false passage with more than three quarters of the lumen still intact.  Removing the remainder part of the access sheath I noted some mild trauma to the distal ureter.  I then re-introduced the 21 French cystoscope over the wire and into the patient's bladder.  I advanced a 28 cm x 6 French double-J ureteral stent over the wire and into the left upper pole.  Once the stent was noted to be well within the upper pole I pulled the scope back to the bladder neck and advanced the stent until it was completely in the bladder before removing the wire.  A curl was noted both in the renal pelvis as well as in the bladder under fluoroscopic guidance.  The bladder was subsequently emptied.  The stent tether was removed prior to stent placement.  A B&O suppository was placed in the patient's rectum.  Lidocaine jelly was instilled into the patient's urethra.  He was subsequently extubated and returned to the PACU in stable condition.  Ardis Hughs, M.D.

## 2018-02-19 NOTE — Discharge Instructions (Signed)
DISCHARGE INSTRUCTIONS FOR KIDNEY STONE/URETERAL STENT   MEDICATIONS:  1.  Resume all your other meds from home - except do not take any extra narcotic pain meds that you may have at home.  2. Pyridium is to help with the burning/stinging when you urinate. 3. Tramadol is for moderate/severe pain, otherwise taking upto 1000 mg every 6 hours of plainTylenol will help treat your pain.     ACTIVITY:  1. No strenuous activity x 1week  2. No driving while on narcotic pain medications  3. Drink plenty of water  4. Continue to walk at home - you can still get blood clots when you are at home, so keep active, but don't over do it.  5. May return to work/school tomorrow or when you feel ready   BATHING:  1. You can shower and we recommend daily showers   SIGNS/SYMPTOMS TO CALL:  Please call us if you have a fever greater than 101.5, uncontrolled nausea/vomiting, uncontrolled pain, dizziness, unable to urinate, bloody urine, chest pain, shortness of breath, leg swelling, leg pain, redness around wound, drainage from wound, or any other concerns or questions.   You can reach [place] at [phone].   FOLLOW-UP:  1. You will be scheduled for repeat ureteroscopy within the next 2 weeks.     Post Anesthesia Home Care Instructions  Activity: Get plenty of rest for the remainder of the day. A responsible individual must stay with you for 24 hours following the procedure.  For the next 24 hours, DO NOT: -Drive a car -Paediatric nurse -Drink alcoholic beverages -Take any medication unless instructed by your physician -Make any legal decisions or sign important papers.  Meals: Start with liquid foods such as gelatin or soup. Progress to regular foods as tolerated. Avoid greasy, spicy, heavy foods. If nausea and/or vomiting occur, drink only clear liquids until the nausea and/or vomiting subsides. Call your physician if vomiting continues.  Special Instructions/Symptoms: Your throat may feel dry  or sore from the anesthesia or the breathing tube placed in your throat during surgery. If this causes discomfort, gargle with warm salt water. The discomfort should disappear within 24 hours.  If you had a scopolamine patch placed behind your ear for the management of post- operative nausea and/or vomiting:  1. The medication in the patch is effective for 72 hours, after which it should be removed.  Wrap patch in a tissue and discard in the trash. Wash hands thoroughly with soap and water. 2. You may remove the patch earlier than 72 hours if you experience unpleasant side effects which may include dry mouth, dizziness or visual disturbances. 3. Avoid touching the patch. Wash your hands with soap and water after contact with the patch.

## 2018-02-19 NOTE — Interval H&P Note (Signed)
History and Physical Interval Note:  02/19/2018 10:21 AM  Adam Ramos  has presented today for surgery, with the diagnosis of LEFT RENAL STONES  The various methods of treatment have been discussed with the patient and family. After consideration of risks, benefits and other options for treatment, the patient has consented to  Procedure(s): LEFT URETEROSCOPY/HOLMIUM LASER/ STONE REMOVAL  STENT PLACEMENT (Left) HOLMIUM LASER APPLICATION (Left) as a surgical intervention .  The patient's history has been reviewed, patient examined, no change in status, stable for surgery.  I have reviewed the patient's chart and labs.  Questions were answered to the patient's satisfaction.     Ardis Hughs

## 2018-02-19 NOTE — Anesthesia Preprocedure Evaluation (Addendum)
Anesthesia Evaluation  Patient identified by MRN, date of birth, ID band Patient awake    Reviewed: Allergy & Precautions, NPO status , Patient's Chart, lab work & pertinent test results  Airway Mallampati: III  TM Distance: >3 FB Neck ROM: Full    Dental no notable dental hx.    Pulmonary former smoker,    Pulmonary exam normal breath sounds clear to auscultation       Cardiovascular negative cardio ROS Normal cardiovascular exam Rhythm:Regular Rate:Normal     Neuro/Psych negative neurological ROS  negative psych ROS   GI/Hepatic negative GI ROS, Neg liver ROS,   Endo/Other  negative endocrine ROS  Renal/GU negative Renal ROS     Musculoskeletal negative musculoskeletal ROS (+)   Abdominal   Peds  Hematology negative hematology ROS (+)   Anesthesia Other Findings LEFT RENAL STONES  Reproductive/Obstetrics                            Anesthesia Physical Anesthesia Plan  ASA: II  Anesthesia Plan: General   Post-op Pain Management:    Induction: Intravenous  PONV Risk Score and Plan: 3 and Ondansetron, Dexamethasone, Midazolam and Treatment may vary due to age or medical condition  Airway Management Planned: LMA  Additional Equipment:   Intra-op Plan:   Post-operative Plan: Extubation in OR  Informed Consent: I have reviewed the patients History and Physical, chart, labs and discussed the procedure including the risks, benefits and alternatives for the proposed anesthesia with the patient or authorized representative who has indicated his/her understanding and acceptance.   Dental advisory given  Plan Discussed with: CRNA  Anesthesia Plan Comments:         Anesthesia Quick Evaluation

## 2018-02-19 NOTE — Anesthesia Procedure Notes (Signed)
Procedure Name: LMA Insertion Date/Time: 02/19/2018 11:06 AM Performed by: Murvin Natal, MD Pre-anesthesia Checklist: Patient identified, Emergency Drugs available, Suction available and Patient being monitored Patient Re-evaluated:Patient Re-evaluated prior to induction Oxygen Delivery Method: Circle system utilized Preoxygenation: Pre-oxygenation with 100% oxygen Induction Type: IV induction Ventilation: Mask ventilation without difficulty LMA: LMA inserted LMA Size: 5.0 Number of attempts: 1 Airway Equipment and Method: Bite block Placement Confirmation: positive ETCO2 and breath sounds checked- equal and bilateral Tube secured with: Tape Dental Injury: Teeth and Oropharynx as per pre-operative assessment

## 2018-02-19 NOTE — Transfer of Care (Signed)
Last Vitals:  Vitals Value Taken Time  BP 128/81 02/19/2018 12:30 PM  Temp    Pulse 60 02/19/2018 12:31 PM  Resp 16 02/19/2018 12:32 PM  SpO2 97 % 02/19/2018 12:31 PM  Vitals shown include unvalidated device data.  Last Pain:  Vitals:   02/19/18 0917  TempSrc:   PainSc: 3       Patients Stated Pain Goal: 5 (02/19/18 2111) Immediate Anesthesia Transfer of Care Note  Patient: Adam Ramos  Procedure(s) Performed: Procedure(s) (LRB): LEFT URETEROSCOPY/HOLMIUM LASER/ STONE REMOVAL  STENT PLACEMENT (Left) HOLMIUM LASER APPLICATION (Left)  Patient Location: PACU  Anesthesia Type: General  Level of Consciousness: awake, alert  and oriented  Airway & Oxygen Therapy: Patient Spontanous Breathing and Patient connected to nasal cannula oxygen  Post-op Assessment: Report given to PACU RN and Post -op Vital signs reviewed and stable  Post vital signs: Reviewed and stable  Complications: No apparent anesthesia complications

## 2018-02-19 NOTE — H&P (Signed)
Non-obstructing stone follow-up  HPI: Adam Ramos is a 58 year-old male established patient who is here today for interval evaluation of non-obstructing kidney stones.  The patient was last seen 06/19/2017.   The patient has not passed any stones since they were last seen. He has not had any flank pain since in the interval. The patient had Left upper pole/UPJ 13 mm plus smaller fragments.   The patient denies any progressive voiding symptoms. He denies dysuria. He does not have hematuria. He has not had fever and chills.   He has had kidney stone surgery. He underwent shockwave lithotripsy. His last stone surgery was 7/18.   Stone composition: calcium oxalate and calcium phosphate. The patient has completed a 24 hour urine collection in the past. The patient is not taking any medications for stone prevention.   The patient underwent No imaging prior to today's appointment.   24 hour urinary collection demonstrates inadequate urine volume with slightly elevated calcium and uric acid supersaturation. He also has a very low pH. Sodium is normal. Citrate is low normal.   Intv: The patient denies any symptoms of kidney stones including flank pain. Denies any hematuria. She has not passed any stones since his last seen. Patient denies any fevers or chills. He has been drinking more water and added lemonade to his regimen.     ALLERGIES: No Allergies    MEDICATIONS: Aspirin Ec 81 mg tablet, delayed release  Ibuprofen 200 MG Oral Tablet Oral     GU PSH: ESWL - 09/02/2016, 2016, 2013, 2013      [place] Notes: Lithotripsy, Lithotripsy, Bladder Cystotomy With Basket Extraction Of Calculus, Shoulder Surgery Right, Lithotripsy, root canal   NON-GU PSH: Remove Ureter Calculus - 2013    GU PMH: Renal calculus - 06/19/2017, - 12/16/2016, Nephrolithiasis, - 2014 Renal and ureteral calculus, Right - 08/26/2016 Low back pain, Lumbago - 2017 Ureteral calculus, Left ureteral calculus - 2016, Calculus of  ureter, - 2015 Abdominal Pain Unspec, Right flank pain - 2015 Other microscopic hematuria, Microscopic hematuria - 2015    NON-GU PMH: Encounter for general adult medical examination without abnormal findings, Encounter for preventive health examination - 2015 Nausea, Nausea - 2015 Hypercalciuria, Hypercalciuria - 2014 Personal history of other diseases of the digestive system, History of esophageal reflux - 2014    FAMILY HISTORY: Family Health Status Of Mother - Alive 27 - Runs In Family Stroke Syndrome - Father   SOCIAL HISTORY: Marital Status: Single Preferred Language: English; Ethnicity: Not Hispanic Or Latino; Race: White Current Smoking Status: Patient does not smoke anymore. Has not smoked since 02/12/1983.   Tobacco Use Assessment Completed: Used Tobacco in last 30 days? Social Drinker.  Drinks 3 caffeinated drinks per day.     Notes: Former smoker, Occupation:, Caffeine Use, Marital History - Single, Alcohol Use   REVIEW OF SYSTEMS:    GU Review Male:   Patient denies frequent urination, hard to postpone urination, burning/ pain with urination, get up at night to urinate, leakage of urine, stream starts and stops, trouble starting your stream, have to strain to urinate , erection problems, and penile pain.  Gastrointestinal (Upper):   Patient denies nausea, vomiting, and indigestion/ heartburn.  Gastrointestinal (Lower):   Patient denies diarrhea and constipation.  Constitutional:   Patient denies fever, night sweats, weight loss, and fatigue.  Skin:   Patient denies skin rash/ lesion and itching.  Eyes:   Patient denies blurred vision and double vision.  Ears/ Nose/ Throat:  Patient denies sore throat and sinus problems.  Hematologic/Lymphatic:   Patient denies swollen glands and easy bruising.  Cardiovascular:   Patient denies leg swelling and chest pains.  Respiratory:   Patient denies cough and shortness of breath.  Endocrine:   Patient denies excessive thirst.   Musculoskeletal:   Patient reports back pain. Patient denies joint pain.  Neurological:   Patient denies headaches and dizziness.  Psychologic:   Patient denies depression and anxiety.   VITAL SIGNS:      01/16/2018 02:48 PM  Weight 225 lb / 102.06 kg  Height 72 in / 182.88 cm  BP 150/92 mmHg  Pulse 62 /min  Temperature 97.8 F / 36.5 C  BMI 30.5 kg/m   MULTI-SYSTEM PHYSICAL EXAMINATION:    Respiratory: Normal breath sounds. No labored breathing, no use of accessory muscles.   Cardiovascular: Regular rate and rhythm. No murmur, no gallop. Normal temperature, normal extremity pulses, no swelling, no varicosities.      PAST DATA REVIEWED:  Source Of History:  Patient  Records Review:   Previous Doctor Records, Previous Patient Records, POC Tool  X-Ray Review: KUB: Reviewed Films. Discussed With Patient.  C.T. Abdomen/Pelvis: Reviewed Films. Discussed With Patient.     PROCEDURES:         KUB - [id number]  A single view of the abdomen is obtained. Renal shadows are easily visualized bilaterally. The patient has a 14 mm stone within the renal pelvis and several smaller stones which may actually be in the parenchyma of the kidney on the left. There are no appreciable stones on the patient's right side. There are no additional calcifications along the expected location of either ureter bilaterally.  Gas pattern is grossly normal. No significant bony abnormalities.      Impression: The patient has a large stone in the left t renal pelvis and several smaller stones that may or may not be within the collecting system on the left side. These are largely unchanged from a KUB 6 months prior.         Urinalysis w/Scope Dipstick Dipstick Cont'd Micro  Color: Yellow Bilirubin: Neg mg/dL WBC/hpf: 0 - 5/hpf  Appearance: Clear Ketones: Neg mg/dL RBC/hpf: 0 - 2/hpf  Specific Gravity: 1.015 Blood: Neg ery/uL Bacteria: Rare (0-9/hpf)  pH: 7.0 Protein: 1+ mg/dL Cystals: NS (Not Seen)  Glucose: Neg  mg/dL Urobilinogen: 0.2 mg/dL Casts: NS (Not Seen)    Nitrites: Neg Trichomonas: Not Present    Leukocyte Esterase: Neg leu/uL Mucous: Not Present      Epithelial Cells: NS (Not Seen)      Yeast: NS (Not Seen)      Sperm: Not Present    ASSESSMENT:      ICD-10 Details  1 GU:   Renal calculus - N20.0    PLAN:           Orders X-Rays: KUB          Schedule         Document Letter(s):  Created for Patient: Clinical Summary         Notes:   The patient has a 14 mm stone in the left UPJ as well as several other calcifications that may be in the mid pole calyx behind this stone or even within the renal parenchyma. I did talk to the patient about his treatment options which actually include all 3 shockwave lithotripsy, ureteroscopy, and PCNL. Ultimately, the patient opted to proceed with ureteroscopy. The patient understands that this may be  a staged procedure given the size and volume of the stone. He also understands that he will have a stent following the surgery. The patient has had this type of operation in the past and has agreed to proceed. Will try to get this scheduled after Christmas.

## 2018-02-20 ENCOUNTER — Encounter (HOSPITAL_BASED_OUTPATIENT_CLINIC_OR_DEPARTMENT_OTHER): Payer: Self-pay | Admitting: Urology

## 2018-02-20 NOTE — Anesthesia Postprocedure Evaluation (Signed)
Anesthesia Post Note  Patient: Adam Ramos  Procedure(s) Performed: LEFT URETEROSCOPY/HOLMIUM LASER/ STONE REMOVAL  STENT PLACEMENT (Left Renal) HOLMIUM LASER APPLICATION (Left Renal)     Patient location during evaluation: PACU Anesthesia Type: General Level of consciousness: awake and alert Pain management: pain level controlled Vital Signs Assessment: post-procedure vital signs reviewed and stable Respiratory status: spontaneous breathing, nonlabored ventilation and respiratory function stable Cardiovascular status: blood pressure returned to baseline and stable Postop Assessment: no apparent nausea or vomiting Anesthetic complications: no    Last Vitals:  Vitals:   02/19/18 1500 02/19/18 1601  BP: (!) 103/59 112/74  Pulse: 61 (!) 58  Resp: 17 14  Temp:    SpO2: 94% 99%    Last Pain:  Vitals:   02/20/18 1445  TempSrc:   PainSc: 4    Pain Goal: Patients Stated Pain Goal: 5 (02/19/18 1249)               Brennan Bailey

## 2018-03-06 DIAGNOSIS — N201 Calculus of ureter: Secondary | ICD-10-CM | POA: Diagnosis not present

## 2018-03-09 DIAGNOSIS — N200 Calculus of kidney: Secondary | ICD-10-CM | POA: Diagnosis not present

## 2018-04-02 DIAGNOSIS — N200 Calculus of kidney: Secondary | ICD-10-CM | POA: Diagnosis not present

## 2018-06-30 DIAGNOSIS — N130 Hydronephrosis with ureteropelvic junction obstruction: Secondary | ICD-10-CM | POA: Diagnosis not present

## 2018-06-30 DIAGNOSIS — N202 Calculus of kidney with calculus of ureter: Secondary | ICD-10-CM | POA: Diagnosis not present

## 2018-09-14 DIAGNOSIS — N390 Urinary tract infection, site not specified: Secondary | ICD-10-CM | POA: Diagnosis not present

## 2018-09-14 DIAGNOSIS — R309 Painful micturition, unspecified: Secondary | ICD-10-CM | POA: Diagnosis not present

## 2018-09-14 DIAGNOSIS — R310 Gross hematuria: Secondary | ICD-10-CM | POA: Diagnosis not present

## 2018-09-14 DIAGNOSIS — B9620 Unspecified Escherichia coli [E. coli] as the cause of diseases classified elsewhere: Secondary | ICD-10-CM | POA: Diagnosis not present

## 2018-09-14 DIAGNOSIS — N200 Calculus of kidney: Secondary | ICD-10-CM | POA: Diagnosis not present

## 2018-09-15 DIAGNOSIS — Z0000 Encounter for general adult medical examination without abnormal findings: Secondary | ICD-10-CM | POA: Diagnosis not present

## 2018-09-18 DIAGNOSIS — R6883 Chills (without fever): Secondary | ICD-10-CM | POA: Diagnosis not present

## 2018-09-18 DIAGNOSIS — Z125 Encounter for screening for malignant neoplasm of prostate: Secondary | ICD-10-CM | POA: Diagnosis not present

## 2018-09-21 DIAGNOSIS — R102 Pelvic and perineal pain: Secondary | ICD-10-CM | POA: Diagnosis not present

## 2018-09-21 DIAGNOSIS — R8271 Bacteriuria: Secondary | ICD-10-CM | POA: Diagnosis not present

## 2018-09-21 DIAGNOSIS — R972 Elevated prostate specific antigen [PSA]: Secondary | ICD-10-CM | POA: Diagnosis not present

## 2018-09-21 DIAGNOSIS — N200 Calculus of kidney: Secondary | ICD-10-CM | POA: Diagnosis not present

## 2018-11-06 DIAGNOSIS — Z125 Encounter for screening for malignant neoplasm of prostate: Secondary | ICD-10-CM | POA: Diagnosis not present

## 2018-11-10 DIAGNOSIS — N410 Acute prostatitis: Secondary | ICD-10-CM | POA: Diagnosis not present

## 2018-11-10 DIAGNOSIS — R972 Elevated prostate specific antigen [PSA]: Secondary | ICD-10-CM | POA: Diagnosis not present

## 2018-11-10 DIAGNOSIS — N3000 Acute cystitis without hematuria: Secondary | ICD-10-CM | POA: Diagnosis not present

## 2018-11-10 DIAGNOSIS — N402 Nodular prostate without lower urinary tract symptoms: Secondary | ICD-10-CM | POA: Diagnosis not present

## 2019-02-03 DIAGNOSIS — R972 Elevated prostate specific antigen [PSA]: Secondary | ICD-10-CM | POA: Diagnosis not present

## 2019-02-09 DIAGNOSIS — R972 Elevated prostate specific antigen [PSA]: Secondary | ICD-10-CM | POA: Diagnosis not present

## 2019-03-22 DIAGNOSIS — R972 Elevated prostate specific antigen [PSA]: Secondary | ICD-10-CM | POA: Diagnosis not present

## 2019-03-22 DIAGNOSIS — C61 Malignant neoplasm of prostate: Secondary | ICD-10-CM | POA: Diagnosis not present

## 2019-03-29 ENCOUNTER — Other Ambulatory Visit: Payer: Self-pay | Admitting: Urology

## 2019-03-29 ENCOUNTER — Other Ambulatory Visit (HOSPITAL_COMMUNITY): Payer: Self-pay | Admitting: Urology

## 2019-03-29 DIAGNOSIS — C61 Malignant neoplasm of prostate: Secondary | ICD-10-CM | POA: Diagnosis not present

## 2019-03-29 DIAGNOSIS — N130 Hydronephrosis with ureteropelvic junction obstruction: Secondary | ICD-10-CM | POA: Diagnosis not present

## 2019-04-12 ENCOUNTER — Encounter (HOSPITAL_COMMUNITY): Payer: BC Managed Care – PPO

## 2019-04-12 ENCOUNTER — Encounter (HOSPITAL_COMMUNITY): Payer: Self-pay

## 2019-04-12 ENCOUNTER — Other Ambulatory Visit: Payer: Self-pay

## 2019-04-12 DIAGNOSIS — H1032 Unspecified acute conjunctivitis, left eye: Secondary | ICD-10-CM | POA: Diagnosis not present

## 2019-04-19 ENCOUNTER — Ambulatory Visit
Admission: RE | Admit: 2019-04-19 | Discharge: 2019-04-19 | Disposition: A | Discharge status: Home / Self Care | Payer: BLUE CROSS/BLUE SHIELD | Source: Ambulatory Visit | Attending: Urology | Admitting: Urology

## 2019-04-19 ENCOUNTER — Other Ambulatory Visit: Payer: Self-pay

## 2019-04-19 DIAGNOSIS — C61 Malignant neoplasm of prostate: Secondary | ICD-10-CM

## 2019-04-19 MED ORDER — GADOBENATE DIMEGLUMINE 529 MG/ML IV SOLN
18.0000 mL | Freq: Once | INTRAVENOUS | Status: AC | PRN
  Administered 2019-04-19: 18 mL via INTRAVENOUS

## 2019-04-20 ENCOUNTER — Encounter (HOSPITAL_COMMUNITY)
Admission: RE | Admit: 2019-04-20 | Discharge: 2019-04-20 | Disposition: A | Discharge status: Home / Self Care | Payer: BC Managed Care – PPO | Source: Ambulatory Visit | Attending: Urology | Admitting: Urology

## 2019-04-20 ENCOUNTER — Other Ambulatory Visit: Payer: Self-pay

## 2019-04-20 DIAGNOSIS — C61 Malignant neoplasm of prostate: Secondary | ICD-10-CM | POA: Diagnosis not present

## 2019-04-20 MED ORDER — TECHNETIUM TC 99M MEDRONATE IV KIT: 20.0000 | PACK | Freq: Once | INTRAVENOUS | Status: DC | PRN

## 2019-04-22 DIAGNOSIS — C61 Malignant neoplasm of prostate: Secondary | ICD-10-CM | POA: Diagnosis not present

## 2019-04-23 ENCOUNTER — Other Ambulatory Visit: Payer: Self-pay | Admitting: Urology

## 2019-04-23 DIAGNOSIS — M6289 Other specified disorders of muscle: Secondary | ICD-10-CM | POA: Diagnosis not present

## 2019-04-23 DIAGNOSIS — M62838 Other muscle spasm: Secondary | ICD-10-CM | POA: Diagnosis not present

## 2019-04-23 DIAGNOSIS — M6281 Muscle weakness (generalized): Secondary | ICD-10-CM | POA: Diagnosis not present

## 2019-05-04 DIAGNOSIS — R8271 Bacteriuria: Secondary | ICD-10-CM | POA: Diagnosis not present

## 2019-05-04 DIAGNOSIS — C61 Malignant neoplasm of prostate: Secondary | ICD-10-CM | POA: Diagnosis not present

## 2019-05-05 ENCOUNTER — Encounter (HOSPITAL_COMMUNITY): Payer: Self-pay

## 2019-05-05 NOTE — Patient Instructions (Addendum)
DUE TO COVID-19 ONLY TWO VISITORS ARE ALLOWED TO COME WITH YOU AND STAY IN THE WAITING ROOM ONLY DURING PRE OP AND PROCEDURE. THE TWO VISITORS MAY VISIT WITH YOU IN YOUR PRIVATE ROOM DURING VISITING HOURS ONLY!!   COVID SWAB TESTING MUST BE COMPLETED ON: Thursday, May 13, 2019 at  2:40 [address], [facility] (Must self quarantine after testing. Follow instructions on handout.)             Your procedure is scheduled on: Monday, May 17, 2019   Report to [facility] Main  Entrance    Report to admitting at 10:15 AM   Call this number if you have problems the morning of surgery [phone]   Do not eat food or drink liquids :After Midnight.   FLEETS ENEMA MORNING OF SURGERY BY 9:00 AM   Oral Hygiene is also important to reduce your risk of infection.                                    Remember - BRUSH YOUR TEETH THE MORNING OF SURGERY WITH YOUR REGULAR TOOTHPASTE   Do NOT smoke after Midnight   Take these medicines the morning of surgery with A SIP OF WATER: TAMSULOSIN                               You may not have any metal on your body including jewelry, and body piercings             Do not wear lotions, powders, perfumes/cologne, or deodorant                        Men may shave face and neck.   Do not bring valuables to the hospital. [place].   Contacts, dentures or bridgework may not be worn into surgery.   Bring small overnight bag day of surgery.    Special Instructions: Bring a copy of your healthcare power of attorney and living will documents  the day of surgery if you haven't scanned them in before.              Please read over the following fact sheets you were given:  [facility] - Preparing for Surgery Before surgery, you can play an important role.  Because skin is not sterile, your skin needs to be as free of germs as possible.   You can reduce the number of germs on your skin by washing with CHG (chlorahexidine gluconate) soap before surgery.  CHG is an antiseptic cleaner which kills germs and bonds with the skin to continue killing germs even after washing. Please DO NOT use if you have an allergy to CHG or antibacterial soaps.  If your skin becomes reddened/irritated stop using the CHG and inform your nurse when you arrive at Short Stay. Do not shave (including legs and underarms) for at least 48 hours prior to the first CHG shower.  You may shave your face/neck.  Please follow these instructions carefully:  1.  Shower with CHG Soap the night before surgery and the  morning of surgery.  2.  If you choose to wash your hair, wash your  hair first as usual with your normal  shampoo.  3.  After you shampoo, rinse your hair and body thoroughly to remove the shampoo.                             4.  Use CHG as you would any other liquid soap.  You can apply chg directly to the skin and wash.  Gently with a scrungie or clean washcloth.  5.  Apply the CHG Soap to your body ONLY FROM THE NECK DOWN.   Do not use on face/ open                           Wound or open sores. Avoid contact with eyes, ears mouth and genitals (private parts).                       Wash face,  Genitals (private parts) with your normal soap.             6.  Wash thoroughly, paying special attention to the area where you surgery  will be performed.  7.  Thoroughly rinse your body with warm water from the neck down.  8.  DO NOT shower/wash with your normal soap after using and rinsing off the CHG Soap.             9.  Pat yourself dry with a clean towel.            10.  Wear clean pajamas.            11.  Place clean sheets on your bed the night of your first shower and do not  sleep with pets. Day of Surgery : Do not apply any lotions/deodorants the morning of surgery.  Please wear clean clothes to the hospital/surgery center.  FAILURE TO FOLLOW THESE  INSTRUCTIONS MAY RESULT IN THE CANCELLATION OF YOUR SURGERY  PATIENT SIGNATURE_________________________________  NURSE SIGNATURE__________________________________  _____  Incentive Spirometer  An incentive spirometer is a tool that can help keep your lungs clear and active. This tool measures how well you are filling your lungs with each breath. Taking long deep breaths may help reverse or decrease the chance of developing breathing (pulmonary) problems (especially infection) following:  A long period of time when you are unable to move or be active. BEFORE THE PROCEDURE   If the spirometer includes an indicator to show your best effort, your nurse or respiratory therapist will set it to a desired goal.  If possible, sit up straight or lean slightly forward. Try not to slouch.  Hold the incentive spirometer in an upright position. INSTRUCTIONS FOR USE  1. Sit on the edge of your bed if possible, or sit up as far as you can in bed or on a chair. 2. Hold the incentive spirometer in an upright position. 3. Breathe out normally. 4. Place the mouthpiece in your mouth and seal your lips tightly around it. 5. Breathe in slowly and as deeply as possible, raising the piston or the ball toward the top of the column. 6. Hold your breath for 3-5 seconds or for as long as possible. Allow the piston or ball to fall to the bottom of the column. 7. Remove the mouthpiece from your mouth and breathe out normally. 8. Rest for a few seconds and repeat Steps 1 through 7 at least 10 times every  1-2 hours when you are awake. Take your time and take a few normal breaths between deep breaths. 9. The spirometer may include an indicator to show your best effort. Use the indicator as a goal to work toward during each repetition. 10. After each set of 10 deep breaths, practice coughing to be sure your lungs are clear. If you have an incision (the cut made at the time of surgery), support your incision when coughing  by placing a pillow or rolled up towels firmly against it. Once you are able to get out of bed, walk around indoors and cough well. You may stop using the incentive spirometer when instructed by your caregiver.  RISKS AND COMPLICATIONS  Take your time so you do not get dizzy or light-headed.  If you are in pain, you may need to take or ask for pain medication before doing incentive spirometry. It is harder to take a deep breath if you are having pain. AFTER USE  Rest and breathe slowly and easily.  It can be helpful to keep track of a log of your progress. Your caregiver can provide you with a simple table to help with this. If you are using the spirometer at home, follow these instructions: [place] IF:   You are having difficultly using the spirometer.  You have trouble using the spirometer as often as instructed.  Your pain medication is not giving enough relief while using the spirometer.  You develop fever of 100.5 F (38.1 C) or higher. SEEK IMMEDIATE MEDICAL CARE IF:   You cough up bloody sputum that had not been present before.  You develop fever of 102 F (38.9 C) or greater.  You develop worsening pain at or near the incision site. MAKE SURE YOU:   Understand these instructions.  Will watch your condition.  Will get help right away if you are not doing well or get worse. Document Released: 06/10/2006 Document Revised: 04/22/2011 Document Reviewed: 08/11/2006 [facility] Patient Information 2014 [employer], [place].   ________________________________________________________________________ ___________________________________________________________________

## 2019-05-06 ENCOUNTER — Other Ambulatory Visit: Payer: Self-pay

## 2019-05-06 ENCOUNTER — Encounter (HOSPITAL_COMMUNITY)
Admission: RE | Admit: 2019-05-06 | Discharge: 2019-05-06 | Disposition: A | Discharge status: Home / Self Care | Payer: BC Managed Care – PPO | Source: Ambulatory Visit | Attending: Urology | Admitting: Urology

## 2019-05-06 ENCOUNTER — Encounter (HOSPITAL_COMMUNITY): Payer: Self-pay

## 2019-05-06 DIAGNOSIS — Z01812 Encounter for preprocedural laboratory examination: Secondary | ICD-10-CM | POA: Diagnosis not present

## 2019-05-06 HISTORY — DX: Malignant neoplasm of prostate: C61

## 2019-05-06 LAB — CBC
HCT: 44.9 % (ref 39.0–52.0)
Hemoglobin: 15.5 g/dL (ref 13.0–17.0)
MCH: 33.3 pg (ref 26.0–34.0)
MCHC: 34.5 g/dL (ref 30.0–36.0)
MCV: 96.6 fL (ref 80.0–100.0)
Platelets: 114 10*3/uL — ABNORMAL LOW (ref 150–400)
RBC: 4.65 MIL/uL (ref 4.22–5.81)
RDW: 13.2 % (ref 11.5–15.5)
WBC: 5.5 10*3/uL (ref 4.0–10.5)
nRBC: 0 % (ref 0.0–0.2)

## 2019-05-06 LAB — COMPREHENSIVE METABOLIC PANEL
ALT: 26 U/L (ref 0–44)
AST: 26 U/L (ref 15–41)
Albumin: 4.5 g/dL (ref 3.5–5.0)
Alkaline Phosphatase: 101 U/L (ref 38–126)
Anion gap: 8 (ref 5–15)
BUN: 12 mg/dL (ref 8–23)
CO2: 28 mmol/L (ref 22–32)
Calcium: 9.6 mg/dL (ref 8.9–10.3)
Chloride: 106 mmol/L (ref 98–111)
Creatinine, Ser: 1.17 mg/dL (ref 0.61–1.24)
GFR calc Af Amer: >60 mL/min (ref >60)
GFR calc non Af Amer: >60 mL/min (ref >60)
Glucose, Bld: 79 mg/dL (ref 70–99)
Potassium: 4 mmol/L (ref 3.5–5.1)
Sodium: 142 mmol/L (ref 135–145)
Total Bilirubin: 1.2 mg/dL (ref 0.3–1.2)
Total Protein: 7.2 g/dL (ref 6.5–8.1)

## 2019-05-06 LAB — TYPE AND SCREEN
ABO/RH(D): A POS
Antibody Screen: NEGATIVE

## 2019-05-06 LAB — ABO/RH: ABO/RH(D): A POS

## 2019-05-06 NOTE — Progress Notes (Signed)
PCP - Dr. Leonia Corona Cardiologist - no  Chest x-ray - no EKG - no Stress Test - no ECHO - no Cardiac Cath - no  Sleep Study - no CPAP -   Fasting Blood Sugar - NA Checks Blood Sugar _____ times a day  Blood Thinner Instructions:NA Aspirin Instructions: Last Dose:  Anesthesia review:   Patient denies shortness of breath, fever, cough and chest pain at PAT appointment  yes Patient verbalized understanding of instructions that were given to them at the PAT appointment. Patient was also instructed that they will need to review over the PAT instructions again at home before surgery. yes

## 2019-05-07 LAB — URINE CULTURE: Culture: NO GROWTH

## 2019-05-11 DIAGNOSIS — N393 Stress incontinence (female) (male): Secondary | ICD-10-CM | POA: Diagnosis not present

## 2019-05-11 DIAGNOSIS — M62838 Other muscle spasm: Secondary | ICD-10-CM | POA: Diagnosis not present

## 2019-05-11 DIAGNOSIS — M6281 Muscle weakness (generalized): Secondary | ICD-10-CM | POA: Diagnosis not present

## 2019-05-13 ENCOUNTER — Other Ambulatory Visit (HOSPITAL_COMMUNITY)
Admission: RE | Admit: 2019-05-13 | Discharge: 2019-05-13 | Disposition: A | Discharge status: Home / Self Care | Payer: BC Managed Care – PPO | Source: Ambulatory Visit | Attending: Urology | Admitting: Urology

## 2019-05-13 DIAGNOSIS — Z01812 Encounter for preprocedural laboratory examination: Secondary | ICD-10-CM | POA: Insufficient documentation

## 2019-05-13 DIAGNOSIS — Z20822 Contact with and (suspected) exposure to covid-19: Secondary | ICD-10-CM | POA: Diagnosis not present

## 2019-05-13 LAB — SARS CORONAVIRUS 2 (TAT 6-24 HRS): SARS Coronavirus 2: NEGATIVE

## 2019-05-17 ENCOUNTER — Ambulatory Visit (HOSPITAL_COMMUNITY): Payer: BC Managed Care – PPO | Admitting: Anesthesiology

## 2019-05-17 ENCOUNTER — Encounter (HOSPITAL_COMMUNITY)
Admission: RE | Disposition: A | Discharge status: Home / Self Care | Payer: Self-pay | Source: Ambulatory Visit | Attending: Urology

## 2019-05-17 ENCOUNTER — Ambulatory Visit (HOSPITAL_COMMUNITY): Payer: BC Managed Care – PPO | Admitting: Physician Assistant

## 2019-05-17 ENCOUNTER — Encounter (HOSPITAL_COMMUNITY): Payer: Self-pay | Admitting: Urology

## 2019-05-17 ENCOUNTER — Observation Stay (HOSPITAL_COMMUNITY)
Admission: RE | Admit: 2019-05-17 | Discharge: 2019-05-18 | Disposition: A | Discharge status: Home / Self Care | Payer: BC Managed Care – PPO | Source: Ambulatory Visit | Attending: Urology | Admitting: Urology

## 2019-05-17 ENCOUNTER — Other Ambulatory Visit: Payer: Self-pay

## 2019-05-17 DIAGNOSIS — N200 Calculus of kidney: Secondary | ICD-10-CM | POA: Diagnosis not present

## 2019-05-17 DIAGNOSIS — Z87891 Personal history of nicotine dependence: Secondary | ICD-10-CM | POA: Insufficient documentation

## 2019-05-17 DIAGNOSIS — K219 Gastro-esophageal reflux disease without esophagitis: Secondary | ICD-10-CM | POA: Diagnosis not present

## 2019-05-17 DIAGNOSIS — C61 Malignant neoplasm of prostate: Principal | ICD-10-CM | POA: Insufficient documentation

## 2019-05-17 HISTORY — PX: PELVIC LYMPH NODE DISSECTION: SHX6543

## 2019-05-17 HISTORY — PX: ROBOT ASSISTED LAPAROSCOPIC RADICAL PROSTATECTOMY: SHX5141

## 2019-05-17 LAB — HEMOGLOBIN AND HEMATOCRIT, BLOOD
HCT: 39.2 % (ref 39.0–52.0)
Hemoglobin: 13.6 g/dL (ref 13.0–17.0)

## 2019-05-17 SURGERY — PROSTATECTOMY, RADICAL, ROBOT-ASSISTED, LAPAROSCOPIC
Anesthesia: General

## 2019-05-17 MED ORDER — MEPERIDINE HCL 50 MG/ML IJ SOLN: 6.2500 mg | INTRAMUSCULAR | Status: DC | PRN

## 2019-05-17 MED ORDER — ONDANSETRON HCL 4 MG/2ML IJ SOLN: 4.0000 mg | Freq: Once | INTRAMUSCULAR | Status: DC | PRN

## 2019-05-17 MED ORDER — PROPOFOL 10 MG/ML IV BOLUS
INTRAVENOUS | Status: AC
  Filled 2019-05-17: qty 20

## 2019-05-17 MED ORDER — PROPOFOL 10 MG/ML IV BOLUS
INTRAVENOUS | Status: DC | PRN
  Administered 2019-05-17: 150 mg via INTRAVENOUS

## 2019-05-17 MED ORDER — BUPIVACAINE-EPINEPHRINE (PF) 0.5% -1:200000 IJ SOLN
INTRAMUSCULAR | Status: AC
  Filled 2019-05-17: qty 30

## 2019-05-17 MED ORDER — LACTATED RINGERS IR SOLN
Status: DC | PRN
  Administered 2019-05-17: 1

## 2019-05-17 MED ORDER — FENTANYL CITRATE (PF) 100 MCG/2ML IJ SOLN
INTRAMUSCULAR | Status: AC
  Filled 2019-05-17: qty 2

## 2019-05-17 MED ORDER — HEMOSTATIC AGENTS (NO CHARGE) OPTIME
TOPICAL | Status: DC | PRN
  Administered 2019-05-17: 1 via TOPICAL

## 2019-05-17 MED ORDER — ROCURONIUM BROMIDE 10 MG/ML (PF) SYRINGE
PREFILLED_SYRINGE | INTRAVENOUS | Status: AC
  Filled 2019-05-17: qty 10

## 2019-05-17 MED ORDER — FENTANYL CITRATE (PF) 100 MCG/2ML IJ SOLN
25.0000 ug | INTRAMUSCULAR | Status: DC | PRN
  Administered 2019-05-17: 50 ug via INTRAVENOUS
  Administered 2019-05-17 (×4): 25 ug via INTRAVENOUS

## 2019-05-17 MED ORDER — ONDANSETRON HCL 4 MG/2ML IJ SOLN
INTRAMUSCULAR | Status: DC | PRN
  Administered 2019-05-17: 4 mg via INTRAVENOUS

## 2019-05-17 MED ORDER — DEXAMETHASONE SODIUM PHOSPHATE 10 MG/ML IJ SOLN
INTRAMUSCULAR | Status: DC | PRN
  Administered 2019-05-17: 10 mg via INTRAVENOUS

## 2019-05-17 MED ORDER — BUPIVACAINE LIPOSOME 1.3 % IJ SUSP
20.0000 mL | Freq: Once | INTRAMUSCULAR | Status: AC
  Administered 2019-05-17: 20 mL
  Filled 2019-05-17: qty 20

## 2019-05-17 MED ORDER — BELLADONNA ALKALOIDS-OPIUM 16.2-60 MG RE SUPP: 1.0000 | Freq: Four times a day (QID) | RECTAL | Status: DC | PRN

## 2019-05-17 MED ORDER — STERILE WATER FOR IRRIGATION IR SOLN
Status: DC | PRN
  Administered 2019-05-17: 1000 mL

## 2019-05-17 MED ORDER — DIPHENHYDRAMINE HCL 12.5 MG/5ML PO ELIX: 12.5000 mg | ORAL_SOLUTION | Freq: Four times a day (QID) | ORAL | Status: DC | PRN

## 2019-05-17 MED ORDER — ACETAMINOPHEN 10 MG/ML IV SOLN
1000.0000 mg | Freq: Four times a day (QID) | INTRAVENOUS | Status: AC
  Administered 2019-05-17 – 2019-05-18 (×4): 1000 mg via INTRAVENOUS
  Filled 2019-05-17 (×3): qty 100

## 2019-05-17 MED ORDER — ACETAMINOPHEN 325 MG PO TABS: 325.0000 mg | ORAL_TABLET | ORAL | Status: DC | PRN

## 2019-05-17 MED ORDER — MIDAZOLAM HCL 2 MG/2ML IJ SOLN
INTRAMUSCULAR | Status: DC | PRN
  Administered 2019-05-17: 2 mg via INTRAVENOUS

## 2019-05-17 MED ORDER — BACITRACIN-NEOMYCIN-POLYMYXIN 400-5-5000 EX OINT: 1.0000 "application " | TOPICAL_OINTMENT | Freq: Three times a day (TID) | CUTANEOUS | Status: DC | PRN

## 2019-05-17 MED ORDER — TRAMADOL HCL 50 MG PO TABS: 50.0000 mg | ORAL_TABLET | Freq: Four times a day (QID) | ORAL | 0 refills | Status: DC | PRN

## 2019-05-17 MED ORDER — SULFAMETHOXAZOLE-TRIMETHOPRIM 800-160 MG PO TABS: 1.0000 | ORAL_TABLET | Freq: Two times a day (BID) | ORAL | 0 refills | Status: DC

## 2019-05-17 MED ORDER — OXYCODONE HCL 5 MG PO TABS: 5.0000 mg | ORAL_TABLET | Freq: Once | ORAL | Status: DC | PRN

## 2019-05-17 MED ORDER — TRAMADOL HCL 50 MG PO TABS
50.0000 mg | ORAL_TABLET | Freq: Four times a day (QID) | ORAL | Status: DC | PRN
  Administered 2019-05-17 – 2019-05-18 (×2): 50 mg via ORAL
  Filled 2019-05-17 (×2): qty 1

## 2019-05-17 MED ORDER — OXYCODONE HCL 5 MG/5ML PO SOLN: 5.0000 mg | Freq: Once | ORAL | Status: DC | PRN

## 2019-05-17 MED ORDER — EPHEDRINE 5 MG/ML INJ
INTRAVENOUS | Status: AC
  Filled 2019-05-17: qty 10

## 2019-05-17 MED ORDER — FENTANYL CITRATE (PF) 100 MCG/2ML IJ SOLN
INTRAMUSCULAR | Status: AC
  Filled 2019-05-17: qty 4

## 2019-05-17 MED ORDER — DOCUSATE SODIUM 100 MG PO CAPS
100.0000 mg | ORAL_CAPSULE | Freq: Two times a day (BID) | ORAL | Status: DC
  Administered 2019-05-17 – 2019-05-18 (×2): 100 mg via ORAL
  Filled 2019-05-17 (×2): qty 1

## 2019-05-17 MED ORDER — ACETAMINOPHEN 10 MG/ML IV SOLN
INTRAVENOUS | Status: AC
  Filled 2019-05-17: qty 100

## 2019-05-17 MED ORDER — SODIUM CHLORIDE 0.9 % IV BOLUS
1000.0000 mL | Freq: Once | INTRAVENOUS | Status: AC
  Administered 2019-05-17: 1000 mL via INTRAVENOUS

## 2019-05-17 MED ORDER — DEXTROSE-NACL 5-0.45 % IV SOLN: INTRAVENOUS | Status: DC

## 2019-05-17 MED ORDER — EPHEDRINE SULFATE-NACL 50-0.9 MG/10ML-% IV SOSY
PREFILLED_SYRINGE | INTRAVENOUS | Status: DC | PRN
  Administered 2019-05-17: 10 mg via INTRAVENOUS

## 2019-05-17 MED ORDER — CEFAZOLIN SODIUM-DEXTROSE 2-4 GM/100ML-% IV SOLN
2.0000 g | INTRAVENOUS | Status: AC
  Administered 2019-05-17: 2 g via INTRAVENOUS
  Filled 2019-05-17: qty 100

## 2019-05-17 MED ORDER — ONDANSETRON HCL 4 MG/2ML IJ SOLN: 4.0000 mg | INTRAMUSCULAR | Status: DC | PRN

## 2019-05-17 MED ORDER — LACTATED RINGERS IV SOLN: INTRAVENOUS | Status: DC

## 2019-05-17 MED ORDER — BUPIVACAINE-EPINEPHRINE 0.5% -1:200000 IJ SOLN
INTRAMUSCULAR | Status: DC | PRN
  Administered 2019-05-17: 30 mL

## 2019-05-17 MED ORDER — DIPHENHYDRAMINE HCL 50 MG/ML IJ SOLN: 12.5000 mg | Freq: Four times a day (QID) | INTRAMUSCULAR | Status: DC | PRN

## 2019-05-17 MED ORDER — FLEET ENEMA 7-19 GM/118ML RE ENEM
1.0000 | ENEMA | Freq: Once | RECTAL | Status: DC
  Filled 2019-05-17: qty 1

## 2019-05-17 MED ORDER — SUGAMMADEX SODIUM 200 MG/2ML IV SOLN
INTRAVENOUS | Status: DC | PRN
  Administered 2019-05-17: 200 mg via INTRAVENOUS

## 2019-05-17 MED ORDER — FENTANYL CITRATE (PF) 100 MCG/2ML IJ SOLN
INTRAMUSCULAR | Status: DC | PRN
  Administered 2019-05-17 (×2): 50 ug via INTRAVENOUS

## 2019-05-17 MED ORDER — ROCURONIUM BROMIDE 10 MG/ML (PF) SYRINGE
PREFILLED_SYRINGE | INTRAVENOUS | Status: DC | PRN
  Administered 2019-05-17 (×2): 10 mg via INTRAVENOUS
  Administered 2019-05-17: 60 mg via INTRAVENOUS
  Administered 2019-05-17: 30 mg via INTRAVENOUS
  Administered 2019-05-17: 10 mg via INTRAVENOUS

## 2019-05-17 MED ORDER — SODIUM CHLORIDE (PF) 0.9 % IJ SOLN
INTRAMUSCULAR | Status: AC
  Filled 2019-05-17: qty 20

## 2019-05-17 MED ORDER — HYDROMORPHONE HCL 1 MG/ML IJ SOLN
0.5000 mg | INTRAMUSCULAR | Status: DC | PRN
  Administered 2019-05-18 (×2): 1 mg via INTRAVENOUS
  Filled 2019-05-17 (×2): qty 1

## 2019-05-17 MED ORDER — MIDAZOLAM HCL 2 MG/2ML IJ SOLN
INTRAMUSCULAR | Status: AC
  Filled 2019-05-17: qty 2

## 2019-05-17 MED ORDER — ACETAMINOPHEN 160 MG/5ML PO SOLN: 325.0000 mg | ORAL | Status: DC | PRN

## 2019-05-17 MED ORDER — LIDOCAINE 2% (20 MG/ML) 5 ML SYRINGE
INTRAMUSCULAR | Status: AC
  Filled 2019-05-17: qty 5

## 2019-05-17 MED ORDER — LIDOCAINE 2% (20 MG/ML) 5 ML SYRINGE
INTRAMUSCULAR | Status: DC | PRN
  Administered 2019-05-17: 60 mg via INTRAVENOUS

## 2019-05-17 SURGICAL SUPPLY — 56 items
APPLICATOR ARISTA FLEXITIP XL (MISCELLANEOUS) ×3 IMPLANT
CATH FOLEY 2WAY SLVR 18FR 30CC (CATHETERS) ×3 IMPLANT
CATH TIEMANN FOLEY 18FR 5CC (CATHETERS) ×3 IMPLANT
CHLORAPREP W/TINT 26 (MISCELLANEOUS) ×3 IMPLANT
CLIP VESOLOCK LG 6/CT PURPLE (CLIP) ×6 IMPLANT
COVER SURGICAL LIGHT HANDLE (MISCELLANEOUS) ×3 IMPLANT
COVER TIP SHEARS 8 DVNC (MISCELLANEOUS) ×2 IMPLANT
COVER TIP SHEARS 8MM DA VINCI (MISCELLANEOUS) ×3
COVER WAND RF STERILE (DRAPES) IMPLANT
CUTTER ECHEON FLEX ENDO 45 340 (ENDOMECHANICALS) ×3 IMPLANT
DECANTER SPIKE VIAL GLASS SM (MISCELLANEOUS) ×3 IMPLANT
DERMABOND ADVANCED (GAUZE/BANDAGES/DRESSINGS) ×1
DERMABOND ADVANCED .7 DNX12 (GAUZE/BANDAGES/DRESSINGS) ×2 IMPLANT
DRAIN CHANNEL RND F F (WOUND CARE) IMPLANT
DRAPE ARM DVNC X/XI (DISPOSABLE) ×8 IMPLANT
DRAPE COLUMN DVNC XI (DISPOSABLE) ×2 IMPLANT
DRAPE DA VINCI XI ARM (DISPOSABLE) ×12
DRAPE DA VINCI XI COLUMN (DISPOSABLE) ×3
DRAPE SURG IRRIG POUCH 19X23 (DRAPES) ×3 IMPLANT
DRSG TEGADERM 4X4.75 (GAUZE/BANDAGES/DRESSINGS) ×3 IMPLANT
ELECT PENCIL ROCKER SW 15FT (MISCELLANEOUS) ×3 IMPLANT
ELECT REM PT RETURN 15FT ADLT (MISCELLANEOUS) ×3 IMPLANT
GAUZE 4X4 16PLY RFD (DISPOSABLE) IMPLANT
GAUZE SPONGE 2X2 8PLY STRL LF (GAUZE/BANDAGES/DRESSINGS) IMPLANT
GLOVE BIO SURGEON STRL SZ 6.5 (GLOVE) ×3 IMPLANT
GLOVE BIOGEL M STRL SZ7.5 (GLOVE) ×6 IMPLANT
GOWN STRL REUS W/TWL LRG LVL3 (GOWN DISPOSABLE) ×3 IMPLANT
GOWN STRL REUS W/TWL XL LVL3 (GOWN DISPOSABLE) ×6 IMPLANT
HEMOSTAT ARISTA ABSORB 3G PWDR (HEMOSTASIS) ×3 IMPLANT
HOLDER FOLEY CATH W/STRAP (MISCELLANEOUS) ×3 IMPLANT
IRRIG SUCT STRYKERFLOW 2 WTIP (MISCELLANEOUS) ×3
IRRIGATION SUCT STRKRFLW 2 WTP (MISCELLANEOUS) ×2 IMPLANT
IV LACTATED RINGERS 1000ML (IV SOLUTION) ×3 IMPLANT
KIT TURNOVER KIT A (KITS) IMPLANT
PACK ROBOT UROLOGY CUSTOM (CUSTOM PROCEDURE TRAY) ×3 IMPLANT
PAD POSITIONING PINK XL (MISCELLANEOUS) ×3 IMPLANT
PENCIL SMOKE EVACUATOR (MISCELLANEOUS) IMPLANT
SEAL CANN UNIV 5-8 DVNC XI (MISCELLANEOUS) ×8 IMPLANT
SEAL XI 5MM-8MM UNIVERSAL (MISCELLANEOUS) ×12
SET TUBE SMOKE EVAC HIGH FLOW (TUBING) ×3 IMPLANT
SOLUTION ELECTROLUBE (MISCELLANEOUS) ×3 IMPLANT
SPONGE GAUZE 2X2 STER 10/PKG (GAUZE/BANDAGES/DRESSINGS)
STAPLE RELOAD 45 GRN (STAPLE) ×2 IMPLANT
STAPLE RELOAD 45MM GREEN (STAPLE) ×3
SUT ETHILON 3 0 PS 1 (SUTURE) ×3 IMPLANT
SUT MNCRL AB 4-0 PS2 18 (SUTURE) ×6 IMPLANT
SUT V-LOC BARB 180 2/0GR6 GS22 (SUTURE) ×3
SUT VIC AB 0 CT1 27 (SUTURE) ×3
SUT VIC AB 0 CT1 27XBRD ANTBC (SUTURE) ×2 IMPLANT
SUT VICRYL 0 UR6 27IN ABS (SUTURE) ×6 IMPLANT
SUT VLOC BARB 180 ABS3/0GR12 (SUTURE) ×6
SUTURE V-LC BRB 180 2/0GR6GS22 (SUTURE) ×2 IMPLANT
SUTURE VLOC BRB 180 ABS3/0GR12 (SUTURE) ×4 IMPLANT
TOWEL OR NON WOVEN STRL DISP B (DISPOSABLE) ×3 IMPLANT
TROCAR XCEL NON-BLD 5MMX100MML (ENDOMECHANICALS) IMPLANT
WATER STERILE IRR 1000ML POUR (IV SOLUTION) ×3 IMPLANT

## 2019-05-17 NOTE — Transfer of Care (Signed)
Immediate Anesthesia Transfer of Care Note  Patient: Adam Ramos  Procedure(s) Performed: XI ROBOTIC ASSISTED LAPAROSCOPIC RADICAL PROSTATECTOMY (N/A ) PELVIC LYMPH NODE DISSECTION (Bilateral )  Patient Location: PACU  Anesthesia Type:General  Level of Consciousness: awake  Airway & Oxygen Therapy: Patient Spontanous Breathing and Patient connected to face mask oxygen  Post-op Assessment: Report given to RN, Post -op Vital signs reviewed and stable and Patient moving all extremities X 4  Post vital signs: Reviewed and stable  Last Vitals:  Vitals Value Taken Time  BP 127/92 05/17/19 1646  Temp    Pulse 72 05/17/19 1647  Resp 17 05/17/19 1647  SpO2 97 % 05/17/19 1647  Vitals shown include unvalidated device data.  Last Pain:  Vitals:   05/17/19 1028  TempSrc:   PainSc: 1       Patients Stated Pain Goal: 4 ([id number])  Complications: No apparent anesthesia complications

## 2019-05-17 NOTE — Anesthesia Procedure Notes (Signed)
Procedure Name: Intubation Date/Time: 05/17/2019 12:48 PM Performed by: Niel Hummer, CRNA Pre-anesthesia Checklist: Patient identified, Emergency Drugs available, Suction available and Patient being monitored Patient Re-evaluated:Patient Re-evaluated prior to induction Oxygen Delivery Method: Circle system utilized Preoxygenation: Pre-oxygenation with 100% oxygen Induction Type: IV induction Ventilation: Mask ventilation without difficulty Laryngoscope Size: Mac and 4 Grade View: Grade I Tube type: Oral Tube size: 7.5 mm Number of attempts: 1 Airway Equipment and Method: Stylet Placement Confirmation: ETT inserted through vocal cords under direct vision,  positive ETCO2 and breath sounds checked- equal and bilateral Secured at: 22 cm Tube secured with: Tape Dental Injury: Teeth and Oropharynx as per pre-operative assessment

## 2019-05-17 NOTE — Anesthesia Preprocedure Evaluation (Addendum)
Anesthesia Evaluation  Patient identified by MRN, date of birth, ID band Patient awake    Reviewed: Allergy & Precautions, NPO status , Patient's Chart, lab work & pertinent test results  Airway Mallampati: I  TM Distance: >3 FB Neck ROM: Full    Dental no notable dental hx. (+) Teeth Intact, Caps,    Pulmonary former smoker,    Pulmonary exam normal breath sounds clear to auscultation       Cardiovascular negative cardio ROS Normal cardiovascular exam Rhythm:Regular Rate:Normal     Neuro/Psych negative neurological ROS  negative psych ROS   GI/Hepatic Neg liver ROS, GERD  ,  Endo/Other  negative endocrine ROS  Renal/GU Renal disease     Musculoskeletal negative musculoskeletal ROS (+)   Abdominal   Peds  Hematology negative hematology ROS (+)   Anesthesia Other Findings LEFT RENAL STONES  Reproductive/Obstetrics                            Anesthesia Physical  Anesthesia Plan  ASA: II  Anesthesia Plan: General   Post-op Pain Management:    Induction: Intravenous  PONV Risk Score and Plan: 2 and Ondansetron, Dexamethasone, Midazolam and Treatment may vary due to age or medical condition  Airway Management Planned: Oral ETT  Additional Equipment:   Intra-op Plan:   Post-operative Plan: Extubation in OR  Informed Consent: I have reviewed the patients History and Physical, chart, labs and discussed the procedure including the risks, benefits and alternatives for the proposed anesthesia with the patient or authorized representative who has indicated his/her understanding and acceptance.     Dental advisory given  Plan Discussed with: CRNA, Anesthesiologist and Surgeon  Anesthesia Plan Comments:        Anesthesia Quick Evaluation

## 2019-05-17 NOTE — Interval H&P Note (Signed)
History and Physical Interval Note:  05/17/2019 12:01 PM  Adam Ramos  has presented today for surgery, with the diagnosis of PROSTATE CANCER.  The various methods of treatment have been discussed with the patient and family. After consideration of risks, benefits and other options for treatment, the patient has consented to  Procedure(s): XI ROBOTIC ASSISTED LAPAROSCOPIC RADICAL PROSTATECTOMY (N/A) PELVIC LYMPH NODE DISSECTION (Bilateral) as a surgical intervention.  The patient's history has been reviewed, patient examined, no change in status, stable for surgery.  I have reviewed the patient's chart and labs.  Questions were answered to the patient's satisfaction.     Ardis Hughs

## 2019-05-17 NOTE — H&P (Signed)
The patient presents today for further discussion of his new diagnosis of prostate cancer.   Prostate cancer profile  Stage: T2a - DRE demonstrates 5 mm nodule left mid gland  PSA: 12.5  Biopsy 03/21/18 , 3/12 cores positive: Gleason 4+4=8 (left lateral base, left mid base, left medial mid-all 20%)  Prostate volume: 25 g, hypoechoic left lateral base    Prostate cancer nomogram after radical prostatectomy:  OC- have been44%  ECE- 52%  SVI- 10%  LNI -14%  PFS (surgery)- 5 yr: 43%, 10 year: 28%   IPSS: 1   The patient has a history of kidney stones which have treated several over the last 5 years. He has no other significant past medical history. The patient lives alone, his sisters are very active in supportive in his life.   Intv: The patient is here for further discussion of his prostate cancer. He underwent a bone scan which demonstrated no evidence of bony metastasis. The MRI demonstrated some seminal vesicle invasion, but otherwise margins were clean.   05/04/19: [name] presents for his preop history and physical. He has already started PFPT and is doing well with this. He denies any changes to his health, medicatoins or voiding habits.     ALLERGIES: No Allergies    MEDICATIONS: Tamsulosin Hcl 0.4 mg capsule 1 capsule PO Daily  Tums     GU PSH: Cystoscopy Insert Stent, Left - 03/06/2018 ESWL - 2018, 2016, 2013, 2013 Prostate Needle Biopsy - 03/22/2019 Ureteroscopic laser litho, Left - 02/19/2018 Ureteroscopic stone removal, Left - 03/06/2018       PSH Notes: Lithotripsy, Lithotripsy, Bladder Cystotomy With Basket Extraction Of Calculus, Shoulder Surgery Right, Lithotripsy, root canal   NON-GU PSH: Remove Ureter Calculus - 2013 Surgical Pathology, Gross And Microscopic Examination For Prostate Needle - 03/22/2019     GU PMH: Prostate Cancer - 03/29/2019 Elevated PSA - 02/09/2019, - 11/10/2018, - 09/21/2018 Acute Cystitis/UTI - 11/10/2018 Acute prostatitis -  11/10/2018 Prostate nodule w/o LUTS - 11/10/2018 Pelvic/perineal pain - 09/21/2018 Gross hematuria - 09/14/2018 Painful micturition, Unspec - 09/14/2018 Hydronephrosis - 06/30/2018 Renal calculus - 04/02/2018, - 01/16/2018, - 06/19/2017, - 12/16/2016, Nephrolithiasis, - 2014 Renal and ureteral calculus, Right - 2018 Low back pain, Lumbago - 2017 Ureteral calculus, Left ureteral calculus - 2016, Calculus of ureter, - 2015 Abdominal Pain Unspec, Right flank pain - 2015 Other microscopic hematuria, Microscopic hematuria - 2015    NON-GU PMH: Muscle weakness (generalized) - 04/23/2019 Other muscle spasm - 04/23/2019 Other specified disorders of muscle - 04/23/2019 Encounter for general adult medical examination without abnormal findings, Encounter for preventive health examination - 2015 Nausea, Nausea - 2015 Hypercalciuria, Hypercalciuria - 2014 Personal history of other diseases of the digestive system, History of esophageal reflux - 2014    FAMILY HISTORY: Family Health Status Of Mother - Alive 83 - Runs In Family Stroke Syndrome - Father   SOCIAL HISTORY: Marital Status: Single Preferred Language: English; Ethnicity: Not Hispanic Or Latino; Race: White Current Smoking Status: Patient does not smoke anymore. Has not smoked since 02/12/1983.   Tobacco Use Assessment Completed: Used Tobacco in last 30 days? Social Drinker.  Drinks 3 caffeinated drinks per day.     Notes: Former smoker, Occupation:, Caffeine Use, Marital History - Single, Alcohol Use   REVIEW OF SYSTEMS:    GU Review Male:   Patient denies frequent urination, hard to postpone urination, burning/ pain with urination, get up at night to urinate, leakage of urine, stream starts and stops, trouble  starting your stream, have to strain to urinate , erection problems, and penile pain.  Gastrointestinal (Upper):   Patient denies nausea, vomiting, and indigestion/ heartburn.  Gastrointestinal (Lower):   Patient denies diarrhea and  constipation.  Constitutional:   Patient denies fever, night sweats, weight loss, and fatigue.  Skin:   Patient denies skin rash/ lesion and itching.  Eyes:   Patient denies blurred vision and double vision.  Ears/ Nose/ Throat:   Patient denies sore throat and sinus problems.  Hematologic/Lymphatic:   Patient denies swollen glands and easy bruising.  Cardiovascular:   Patient denies leg swelling and chest pains.  Respiratory:   Patient denies cough and shortness of breath.  Endocrine:   Patient denies excessive thirst.  Musculoskeletal:   Patient denies back pain and joint pain.  Neurological:   Patient denies headaches and dizziness.  Psychologic:   Patient denies depression and anxiety.   VITAL SIGNS:      05/04/2019 01:57 PM  Weight 215 lb / 97.52 kg  Height 72 in / 182.88 cm  BP 127/80 mmHg  Pulse 58 /min  Temperature 97.5 F / 36.3 C  BMI 29.2 kg/m   GU PHYSICAL EXAMINATION:    Anus and Perineum: No hemorrhoids. No anal stenosis. No rectal fissure, no anal fissure. No edema, no dimple, no perineal tenderness, no anal tenderness.  Scrotum: No lesions. No edema. No cysts. No warts.  Epididymides: Right: no spermatocele, no masses, no cysts, no tenderness, no induration, no enlargement. Left: no spermatocele, no masses, no cysts, no tenderness, no induration, no enlargement.  Testes: No tenderness, no swelling, no enlargement left testes. No tenderness, no swelling, no enlargement right testes. Normal location left testes. Normal location right testes. No mass, no cyst, no varicocele, no hydrocele left testes. No mass, no cyst, no varicocele, no hydrocele right testes.  Urethral Meatus: Normal size. No lesion, no wart, no discharge, no polyp. Normal location.  Penis: Circumcised, no warts, no cracks. No dorsal Peyronie's plaques, no left corporal Peyronie's plaques, no right corporal Peyronie's plaques, no scarring, no warts. No balanitis, no meatal stenosis.  Prostate: 40 gram or 2+  size. Left lobe normal consistency, right lobe normal consistency. Symmetrical lobes. No prostate nodule. Left lobe no tenderness, right lobe no tenderness.  Seminal Vesicles: Nonpalpable.  Sphincter Tone: Normal sphincter. No rectal tenderness. No rectal mass.    MULTI-SYSTEM PHYSICAL EXAMINATION:    Constitutional: Well-nourished. No physical deformities. Normally developed. Good grooming.  Neck: Neck symmetrical, not swollen. Normal tracheal position.  Respiratory: No labored breathing, no use of accessory muscles.   Cardiovascular: Normal temperature, normal extremity pulses, no swelling, no varicosities.  Lymphatic: No enlargement of neck, axillae, groin.  Skin: No paleness, no jaundice, no cyanosis. No lesion, no ulcer, no rash.  Neurologic / Psychiatric: Oriented to time, oriented to place, oriented to person. No depression, no anxiety, no agitation.  Gastrointestinal: No mass, no tenderness, no rigidity, non obese abdomen.  Eyes: Normal conjunctivae. Normal eyelids.  Ears, Nose, Mouth, and Throat: Left ear no scars, no lesions, no masses. Right ear no scars, no lesions, no masses. Nose no scars, no lesions, no masses. Normal hearing. Normal lips.  Musculoskeletal: Normal gait and station of head and neck.     PAST DATA REVIEWED:  Source Of History:  Patient  Records Review:   Previous Doctor Records, Previous Patient Records  Urine Test Review:   Urinalysis, Urine Culture   02/04/19 11/06/18  PSA  Total PSA 12.50 ng/mL 10.70  ng/mL    05/04/19  Urinalysis  Urine Appearance Clear   Urine Color Yellow   Urine Glucose Neg mg/dL  Urine Bilirubin Neg mg/dL  Urine Ketones Neg mg/dL  Urine Specific Gravity 1.020   Urine Blood Neg ery/uL  Urine pH 5.5   Urine Protein Trace mg/dL  Urine Urobilinogen 0.2 mg/dL  Urine Nitrites Neg   Urine Leukocyte Esterase Neg leu/uL   PROCEDURES:          Urinalysis Dipstick Dipstick Cont'd  Color: Yellow Bilirubin: Neg mg/dL  Appearance:  Clear Ketones: Neg mg/dL  Specific Gravity: 1.020 Blood: Neg ery/uL  pH: 5.5 Protein: Trace mg/dL  Glucose: Neg mg/dL Urobilinogen: 0.2 mg/dL    Nitrites: Neg    Leukocyte Esterase: Neg leu/uL    ASSESSMENT:      ICD-10 Details  1 GU:   Prostate Cancer - C61    PLAN:           Orders Labs CULTURE, URINE          Schedule Return Visit/Planned Activity: Keep Scheduled Appointment          Document Letter(s):  Created for Patient: Clinical Summary         Notes:   Urinalysis is clear, precautionary culture sent. No acute changes tohis health at this time. All of his questions answered about his upcoming surgery to the best of my ability. He was advised to contact the office with any conerns or questions.

## 2019-05-17 NOTE — Op Note (Signed)
Preoperative diagnosis:  1. Prostate Cancer   Postoperative diagnosis:  1. same   Procedure: 1. Robotic assisted laparoscopic radical prostatectomy 2. Bilateral pelvic lymph node dissection  Surgeon: Ardis Hughs, MD First Assistant: Debbrah Alar, PA  Anesthesia: General  Complications: None  Intraoperative findings:  #1: wide left margin, grossly negative #2: right sided nerve spare  EBL: 250cc  Specimens:  #1.  Prostate and seminal vesicals #2.  Bilateral pelvic lymph nodes  Indication: Adam Ramos is a 59 y.o. patient with prostate cancer.  After reviewing the management options for treatment, he elected to proceed with the removal of his prostate. We have discussed the potential benefits and risks of the procedure, side effects of the proposed treatment, the likelihood of the patient achieving the goals of the procedure, and any potential problems that might occur during the procedure or recuperation. Informed consent has been obtained.  Description of procedure:  The patient was consented in the preoperative holding area. He was in brought back to the operating room placed the table in supine position. General anesthesia was then induced and endotracheal tube was inserted. He was then placed in dorsolithotomy position and placed in steep Trendelenburg. He was then prepped and draped in the routine sterile fashion. We, the first assistant and I, then began by making a 10 mm incision supraumbilical midline incision the skin down through into the peritoneum. Then placed a 8 mm trocar. I then inflated the abdomen and inserted the 0 robotic lens. We then placed 2 additional a 8 millimeter trochars in the patient's left lower abdomen proximally 9 cm apart and 2 trochars on the patient's right lower abdomen, one was an 8 mm trocar and the one most lateral was a 12 mm trocar which was used as the assistant port. A 5 mm trocar was placed by triangulating the 2 right lateral  ports as a second assistant port. These ports were all placed under visual guidance. Once the ports were noted to be satisfactory position the robot was docked. We started with the 0 lens, monopolar scissors in the right hand and the [place] forceps the left hand as well as a fenestrated grasper as the third arm on the left-hand side.   We, the first assistant and I,  began our dissection the posterior plane incising the peritoneum at the level of the vas deferens. Isolated the left vas deferens and dissected it proximally towards the spermatic cord for 5 cm prior to ligating it. Then used this as traction to isolate the left the seminal vesicle which was then undressed bluntly and completely dissected out, all vessels were cauterized with a combination of bipolar and the monopolar scissors. We then turned our attention to the right side and similarly dissected out the right vas deferens and seminal vesicle. Once the SVs had been freed, we turned our attention to the posterior plane and bluntly dissected the tissue between the rectum and the posterior wall of the prostate bluntly out towards the apex.    At this point the bladder was taken down starting at the urachal remnant with a combination of both blunt dissection and sharp dissection using monopolar cautery the bladder was dropped down in the usual fashion to the medial umbilical ligaments laterally and the dorsal vein of the prostate anteriorly creating our space of Retzius. We then turned our attention to the endopelvic fascia which was incised laterally starting on the patient's right-hand side the levator muscles were pushed off the prostate laterally up  towards the dorsal vein complex on the right-hand side. This process was then repeated on the left-hand side and a nice notch was created for the dorsal vein. I then used a 5mm stapler to staple the dorsal vein.   We,the first assistant and I, then located the bladder neck at the vesicoprostatic  junction and using the monopolar scissors dissected down through the perivesical tissues and the bladder neck down to the prostatic urethra. The catheter was then deflated and pulled through our urethral opening and then used to retract the prostate anteriorly for the posterior bladder neck dissection. Once through the bladder neck and into the posterior plane of the prostate, the SVs were brought through the opening. The right pedicle was then isolated and systematically ligated with Weck clips and scissors. The nerve bundle was then peeled off the posterior lateral aspect of the prostate and bluntly dissected away off the prostate. The left pedicle was then taken widely in attempt to get around all the cancer and no nerve sparing was attempted.    I then came down through the dorsal venous complex anteriorly down to the membranous urethra using the monopolar. Once down to the urethra, it was transected sharply and the apex of the prostate was then dissected off the levator and rectourethralis muscles. Once the apex of the prostate had been dissected free we came back to the base of the prostate and bluntly push the rectum and nerve vascular bundle off the prostate the patient's left and used clips on the patient's right to free the prostate. Once the prostate was free it was placed off to the side. The pelvis was then irrigated with normal saline and noted to be relatively hemostatic.  Attention was then turned to the right pelvic sidewall. The fibrofatty tissue between the external iliac vein, confluence of the iliac vessels, hypogastric artery, and Cooper's ligament was dissected free from the pelvic sidewall with care to preserve the obturator nerve. Weck clips were used for lymphostasis and hemostasis. An identical procedure was performed on the contralateral side and the lymphatic packets were removed for permanent pathologic analysis.  The prostate and both lymph node tissues were placed in the Endo  Catch bag and the string brought to the 5 mm port.    The vesicourethral anastomosis was then completed with 2 interlocking 3-0 V. lock sutures running the anastomosis in the 6:00 position to the 12:00 position on each side and then tying it off on the top. The final catheter was then passed through the patient's urethra and into the bladder and 120 cc was instilled into the bladder to test the anastomosis. As there was no leak a 76 [place] Blake drain was passed through the left lateral port and placed around the vesicourethral anastomosis. A 12 mm assistant port on the right lateral side was then closed with 0 Vicryl with the help of the [employer] needle. The 12 mm midline infraumbilical incision was then extended another centimeter taken down and the fascia opened to remove the Endo Catch bag with the prostate specimen. The fascia was then closed with a 0 Vicryl and all skin ports were closed with 4-0 Monocryl in a subcutaneous fashion. Dermabond glue was then applied to the incisions. The drain was then secured to the skin with a 0 nylon stitch and dressing applied.   At the end of the case all laps needles and sponges had been accounted for. There no immediate complications. The patient returned to the PACU in stable  condition.

## 2019-05-17 NOTE — Plan of Care (Signed)
Patient will call for assistance when needing to get out of bed. Call bell and personal belongings are within reach. Bed alarm set.

## 2019-05-18 ENCOUNTER — Encounter: Payer: Self-pay | Admitting: *Deleted

## 2019-05-18 DIAGNOSIS — Z87891 Personal history of nicotine dependence: Secondary | ICD-10-CM | POA: Diagnosis not present

## 2019-05-18 DIAGNOSIS — C61 Malignant neoplasm of prostate: Secondary | ICD-10-CM | POA: Diagnosis not present

## 2019-05-18 LAB — BASIC METABOLIC PANEL
Anion gap: 5 (ref 5–15)
BUN: 15 mg/dL (ref 8–23)
CO2: 24 mmol/L (ref 22–32)
Calcium: 8.6 mg/dL — ABNORMAL LOW (ref 8.9–10.3)
Chloride: 107 mmol/L (ref 98–111)
Creatinine, Ser: 1.22 mg/dL (ref 0.61–1.24)
GFR calc Af Amer: >60 mL/min (ref >60)
GFR calc non Af Amer: >60 mL/min (ref >60)
Glucose, Bld: 176 mg/dL — ABNORMAL HIGH (ref 70–99)
Potassium: 4.5 mmol/L (ref 3.5–5.1)
Sodium: 136 mmol/L (ref 135–145)

## 2019-05-18 LAB — CBC
HCT: 33.1 % — ABNORMAL LOW (ref 39.0–52.0)
Hemoglobin: 11.5 g/dL — ABNORMAL LOW (ref 13.0–17.0)
MCH: 33.6 pg (ref 26.0–34.0)
MCHC: 34.7 g/dL (ref 30.0–36.0)
MCV: 96.8 fL (ref 80.0–100.0)
Platelets: 114 10*3/uL — ABNORMAL LOW (ref 150–400)
RBC: 3.42 MIL/uL — ABNORMAL LOW (ref 4.22–5.81)
RDW: 13.5 % (ref 11.5–15.5)
WBC: 14.4 10*3/uL — ABNORMAL HIGH (ref 4.0–10.5)
nRBC: 0 % (ref 0.0–0.2)

## 2019-05-18 LAB — HEMOGLOBIN AND HEMATOCRIT, BLOOD
HCT: 31.9 % — ABNORMAL LOW (ref 39.0–52.0)
Hemoglobin: 11.2 g/dL — ABNORMAL LOW (ref 13.0–17.0)

## 2019-05-18 MED ORDER — CHLORHEXIDINE GLUCONATE CLOTH 2 % EX PADS: 6.0000 | MEDICATED_PAD | Freq: Every day | CUTANEOUS | Status: DC

## 2019-05-18 NOTE — Progress Notes (Signed)
No change from am assessment. Pt A&Ox4, ambulatory. Discharge instructions were reviewed. Questions concerns denied at this time. Dressing remains CDI. Foley cathter and dressing  supplies provided.

## 2019-05-18 NOTE — H&P (Signed)
Date of admission: 05/17/2019  Date of discharge: 05/18/2019  Admission diagnosis: prostate cancer  Discharge diagnosis: same  Secondary diagnoses:  Patient Active Problem List   Diagnosis Date Noted  . Prostate cancer ([facility]) 05/17/2019    Procedures performed: Procedure(s): XI ROBOTIC ASSISTED LAPAROSCOPIC RADICAL PROSTATECTOMY PELVIC LYMPH NODE DISSECTION  History and Physical: For full details, please see admission history and physical. Briefly, Adam Ramos is a 59 y.o. year old patient with prostate cancer.   Hospital Course: Patient tolerated the procedure well.  He was then transferred to the floor after an uneventful PACU stay.  His hospital course was uncomplicated.  On POD#1 he had met discharge criteria: was eating a liquid diet, was up and ambulating independently,  pain was well controlled,  and was ready to for discharge.   Laboratory values:  Recent Labs    05/17/19 1700 05/18/19 0448 05/18/19 0957  WBC  --   --  14.4*  HGB 13.6 11.2* 11.5*  HCT 39.2 31.9* 33.1*   Recent Labs    05/18/19 0448  NA 136  K 4.5  CL 107  CO2 24  GLUCOSE 176*  BUN 15  CREATININE 1.22  CALCIUM 8.6*   No results for input(s): LABPT, INR in the last 72 hours. No results for input(s): LABURIN in the last 72 hours. Results for orders placed or performed during the hospital encounter of 05/13/19  SARS CORONAVIRUS 2 (TAT 6-24 HRS) Nasopharyngeal Nasopharyngeal Swab     Status: None   Collection Time: 05/13/19  2:45 PM   Specimen: Nasopharyngeal Swab  Result Value Ref Range Status   SARS Coronavirus 2 NEGATIVE NEGATIVE Final    Comment: (NOTE) SARS-CoV-2 target nucleic acids are NOT DETECTED. The SARS-CoV-2 RNA is generally detectable in upper and lower respiratory specimens during the acute phase of infection. Negative results do not preclude SARS-CoV-2 infection, do not rule out co-infections with other pathogens, and should not be used as the sole basis for treatment or  other patient management decisions. Negative results must be combined with clinical observations, patient history, and epidemiological information. The expected result is Negative. Fact Sheet for Patients: SugarRoll.be Fact Sheet for Healthcare Providers: https://www.woods-mathews.com/ This test is not yet approved or cleared by the [place] FDA and  has been authorized for detection and/or diagnosis of SARS-CoV-2 by FDA under an Emergency Use Authorization (EUA). This EUA will remain  in effect (meaning this test can be used) for the duration of the COVID-19 declaration under Section 56 4(b)(1) of the Act, 21 U.S.C. section 360bbb-3(b)(1), unless the authorization is terminated or revoked sooner. Performed at [facility], [place] [address] [place], [place] [address]     Disposition: Home  Discharge instruction: The patient was instructed to be ambulatory but told to refrain from heavy lifting, strenuous activity, or driving.   Discharge medications:  Allergies as of 05/18/2019   No Known Allergies     Medication List    STOP taking these medications   multivitamin with minerals Tabs tablet   tamsulosin 0.4 MG Caps capsule Commonly known as: FLOMAX     TAKE these medications   ibuprofen 200 MG tablet Commonly known as: ADVIL Take 200-400 mg by mouth every 6 (six) hours as needed for moderate pain.   sulfamethoxazole-trimethoprim 800-160 MG tablet Commonly known as: BACTRIM DS Take 1 tablet by mouth 2 (two) times daily. Start the day prior to foley removal appointment   traMADol 50 MG tablet Commonly known as: Ultram  Take 1-2 tablets (50-100 mg total) by mouth every 6 (six) hours as needed for moderate pain or severe pain.       Followup:  Follow-up Information    Karen Kays, NP On 05/24/2019.   Specialty: Nurse Practitioner Why: at 9:30 Contact information: [address] [place]  [id number] [phone]

## 2019-05-18 NOTE — Discharge Instructions (Signed)
Indwelling Urinary Catheter Care, Adult An indwelling urinary catheter is a thin tube that is put into your bladder. The tube helps to drain pee (urine) out of your body. The tube goes in through your urethra. Your urethra is where pee comes out of your body. Your pee will come out through the catheter, then it will go into a bag (drainage bag). Take good care of your catheter so it will work well. How to wear your catheter and bag Supplies needed Sticky tape (adhesive tape) or a leg strap. Alcohol wipe or soap and water (if you use tape). A clean towel (if you use tape). Large overnight bag. Smaller bag (leg bag). Wearing your catheter Attach your catheter to your leg with tape or a leg strap. Make sure the catheter is not pulled tight. If a leg strap gets wet, take it off and put on a dry strap. If you use tape to hold the bag on your leg: Use an alcohol wipe or soap and water to wash your skin where the tape made it sticky before. Use a clean towel to pat-dry that skin. Use new tape to make the bag stay on your leg. Wearing your bags You should have been given a large overnight bag. You may wear the overnight bag in the day or night. Always have the overnight bag lower than your bladder.  Do not let the bag touch the floor. Before you go to sleep, put a clean plastic bag in a wastebasket. Then hang the overnight bag inside the wastebasket. You should also have a smaller leg bag that fits under your clothes. Always wear the leg bag below your knee. Do not wear your leg bag at night. How to care for your skin and catheter Supplies needed A clean washcloth. Water and mild soap. A clean towel. Caring for your skin and catheter     Clean the skin around your catheter every day: Wash your hands with soap and water. Wet a clean washcloth in warm water and mild soap. Clean the skin around your urethra. If you are male: Gently spread the folds of skin around your vagina  (labia). With the washcloth in your other hand, wipe the inner side of your labia on each side. Wipe from front to back. If you are male: Pull back any skin that covers the end of your penis (foreskin). With the washcloth in your other hand, wipe your penis in small circles. Start wiping at the tip of your penis, then move away from the catheter. Move the foreskin back in place, if needed. With your free hand, hold the catheter close to where it goes into your body. Keep holding the catheter during cleaning so it does not get pulled out. With the washcloth in your other hand, clean the catheter. Only wipe downward on the catheter. Do not wipe upward toward your body. Doing this may push germs into your urethra and cause infection. Use a clean towel to pat-dry the catheter and the skin around it. Make sure to wipe off all soap. Wash your hands with soap and water. Shower every day. Do not take baths. Do not use cream, ointment, or lotion on the area where the catheter goes into your body, unless your doctor tells you to. Do not use powders, sprays, or lotions on your genital area. Check your skin around the catheter every day for signs of infection. Check for: Redness, swelling, or pain. Fluid or blood. Warmth. Pus or a bad smell.  How to empty the bag Supplies needed Rubbing alcohol. Gauze pad or cotton ball. Tape or a leg strap. Emptying the bag Pour the pee out of your bag when it is ?- full, or at least 2-3 times a day. Do this for your overnight bag and your leg bag. Wash your hands with soap and water. Separate (detach) the bag from your leg. Hold the bag over the toilet or a clean pail. Keep the bag lower than your hips and bladder. This is so the pee (urine) does not go back into the tube. Open the pour spout. It is at the bottom of the bag. Empty the pee into the toilet or pail. Do not let the pour spout touch any surface. Put rubbing alcohol on a gauze pad or cotton  ball. Use the gauze pad or cotton ball to clean the pour spout. Close the pour spout. Attach the bag to your leg with tape or a leg strap. Wash your hands with soap and water. Follow instructions for cleaning the drainage bag: From the product maker. As told by your doctor. How to change the bag Supplies needed Alcohol wipes. A clean bag. Tape or a leg strap. Changing the bag Replace your bag when it starts to leak, smell bad, or look dirty. Wash your hands with soap and water. Separate the dirty bag from your leg. Pinch the catheter with your fingers so that pee does not spill out. Separate the catheter tube from the bag tube where these tubes connect (at the connection valve). Do not let the tubes touch any surface. Clean the end of the catheter tube with an alcohol wipe. Use a different alcohol wipe to clean the end of the bag tube. Connect the catheter tube to the tube of the clean bag. Attach the clean bag to your leg with tape or a leg strap. Do not make the bag tight on your leg. Wash your hands with soap and water. General rules  Never pull on your catheter. Never try to take it out. Doing that can hurt you. Always wash your hands before and after you touch your catheter or bag. Use a mild, fragrance-free soap. If you do not have soap and water, use hand sanitizer. Always make sure there are no twists or bends (kinks) in the catheter tube. Always make sure there are no leaks in the catheter or bag. Drink enough fluid to keep your pee pale yellow. Do not take baths, swim, or use a hot tub. If you are male, wipe from front to back after you poop (have a bowel movement). Contact a doctor if: Your pee is cloudy. Your pee smells worse than usual. Your catheter gets clogged. Your catheter leaks. Your bladder feels full. Get help right away if: You have redness, swelling, or pain where the catheter goes into your body. You have fluid, blood, pus, or a bad smell coming from  the area where the catheter goes into your body. Your skin feels warm where the catheter goes into your body. You have a fever. You have pain in your: Belly (abdomen). Legs. Lower back. Bladder. You see blood in the catheter. Your pee is pink or red. You feel sick to your stomach (nauseous). You throw up (vomit). You have chills. Your pee is not draining into the bag. Your catheter gets pulled out. Summary An indwelling urinary catheter is a thin tube that is placed into the bladder to help drain pee (urine) out of the body. The catheter  is placed into the part of the body that drains pee from the bladder (urethra). Taking good care of your catheter will keep it working properly and help prevent problems. Always wash your hands before and after touching your catheter or bag. Never pull on your catheter or try to take it out. This information is not intended to replace advice given to you by your health care provider. Make sure you discuss any questions you have with your health care provider. Document Revised: 05/22/2018 Document Reviewed: 09/13/2016 Elsevier Patient Education  [place]. 1. Activity:  You are encouraged to ambulate frequently (about every hour during waking hours) to help prevent blood clots from forming in your legs or lungs.  However, you should not engage in any heavy lifting (> 10-15 lbs), strenuous activity, or straining. 2. Diet: You should continue a clear liquid diet until passing gas from below.  Once this occurs, you may advance your diet to a soft diet that would be easy to digest (i.e soups, scrambled eggs, mashed potatoes, etc.) for 24 hours just as you would if getting over a bad stomach flu.  If tolerating this diet well for 24 hours, you may then begin eating regular food.  It will be normal to have some amount of bloating, nausea, and abdominal discomfort intermittently. 3. Prescriptions:  You will be provided a prescription for pain medication to  take as needed.  If your pain is not severe enough to require the prescription pain medication, you may take Tylenol instead.  You should also take an over the counter stool softener (Colace 100 mg twice daily) to avoid straining with bowel movements as the pain medication may constipate you. Finally, you will also be provided a prescription for an antibiotic to begin the day prior to your return visit in the office for catheter removal. 4. Catheter care: You will be taught how to take care of the catheter by the nursing staff prior to discharge from the hospital.  You may use both a leg bag and the larger bedside bag but it is recommended to at least use the bigger bedside bag at nighttime as the leg bag is small and will fill up overnight and also does not drain as well when lying flat. You may periodically feel a strong urge to void with the catheter in place.  This is a bladder spasm and most often can occur when having a bowel movement or when you are moving around. It is typically self-limited and usually will stop after a few minutes.  You may use some Vaseline or Neosporin around the tip of the catheter to reduce friction at the tip of the penis. 5. Incisions: You may remove your dressing bandages the 2nd day after surgery.  You most likely will have a few small staples in each of the incisions and once the bandages are removed, the incisions may stay open to air.  You may start showering (not soaking or bathing in water) 48 hours after surgery and the incisions simply need to be patted dry after the shower.  No additional care is needed. 6. What to call us about: You should call the office [phone]) if you develop fever > 101, persistent vomiting, or the catheter stops draining. Also, feel free to call with any other questions you may have and remember the handout that was provided to you as a reference preoperatively which answers many of the common questions that arise after surgery. 7. You may  resume aspirin,  advil, aleve, vitamins, and supplements 7 days after surgery.

## 2019-05-18 NOTE — Progress Notes (Signed)
1 Day Post-Op Subjective: The patient is doing well.  No nausea or vomiting. Pain is adequately controlled.  Objective: Vital signs in last 24 hours: Temp:  [96.4 F (35.8 C)-99.8 F (37.7 C)] 98.6 F (37 C) (04/06 0559) Pulse Rate:  [52-71] 52 (04/06 0559) Resp:  [10-20] 18 (04/06 0559) BP: (92-132)/(60-92) 92/64 (04/06 0559) SpO2:  [91 %-98 %] 95 % (04/06 0559) Weight:  [97.1 kg] 97.1 kg (04/05 1028)  Intake/Output from previous day: 04/05 0701 - 04/06 0700 In: 3871.6 [I.V.:2571.6; IV Piggyback:1300] Out: 1490 [Urine:745; Drains:495; Blood:250] Intake/Output this shift: Total I/O In: -  Out: 20 [Drains:20]  Physical Exam:  General: Alert and oriented. GI: Soft, Nondistended. Incisions: Dressings intact. Urine: Clear Extremities: Nontender, no erythema, no edema.  Lab Results: Recent Labs    05/17/19 1700 05/18/19 0448  HGB 13.6 11.2*  HCT 39.2 31.9*   Recent Labs    05/18/19 0448  NA 136  K 4.5  CL 107  CO2 24  GLUCOSE 176*  BUN 15  CREATININE 1.22  CALCIUM 8.6*      Assessment/Plan: POD# 1 s/p robotic prostatectomy. Hgb trending down, recheck at 10:30  1) SL IVF 2) Ambulate, Incentive spirometry 3) Transition to oral pain medication 4) D/C pelvic drain 5) Plan for likely discharge later today   LOS: 0 days   Ardis Hughs 05/18/2019, 9:05 AM

## 2019-05-18 NOTE — Progress Notes (Signed)
JP drain removed, dressing applied. Will continue to monitor.

## 2019-05-19 NOTE — Anesthesia Postprocedure Evaluation (Signed)
Anesthesia Post Note  Patient: Adam Ramos  Procedure(s) Performed: XI ROBOTIC ASSISTED LAPAROSCOPIC RADICAL PROSTATECTOMY (N/A ) PELVIC LYMPH NODE DISSECTION (Bilateral )     Patient location during evaluation: PACU Anesthesia Type: General Level of consciousness: awake and alert Pain management: pain level controlled Vital Signs Assessment: post-procedure vital signs reviewed and stable Respiratory status: spontaneous breathing, nonlabored ventilation, respiratory function stable and patient connected to nasal cannula oxygen Cardiovascular status: blood pressure returned to baseline and stable Postop Assessment: no apparent nausea or vomiting Anesthetic complications: no    Last Vitals:  Vitals:   05/18/19 0559 05/18/19 1023  BP: 92/64 120/74  Pulse: (!) 52 (!) 53  Resp: 18 16  Temp: 37 C   SpO2: 95% 97%    Last Pain:  Vitals:   05/18/19 1020  TempSrc:   PainSc: [name]

## 2019-05-23 LAB — SURGICAL PATHOLOGY

## 2019-06-14 DIAGNOSIS — N393 Stress incontinence (female) (male): Secondary | ICD-10-CM | POA: Diagnosis not present

## 2019-06-14 DIAGNOSIS — M62838 Other muscle spasm: Secondary | ICD-10-CM | POA: Diagnosis not present

## 2019-06-14 DIAGNOSIS — M6281 Muscle weakness (generalized): Secondary | ICD-10-CM | POA: Diagnosis not present

## 2019-06-29 DIAGNOSIS — N393 Stress incontinence (female) (male): Secondary | ICD-10-CM | POA: Diagnosis not present

## 2019-06-29 DIAGNOSIS — M62838 Other muscle spasm: Secondary | ICD-10-CM | POA: Diagnosis not present

## 2019-06-29 DIAGNOSIS — M6281 Muscle weakness (generalized): Secondary | ICD-10-CM | POA: Diagnosis not present

## 2019-06-29 DIAGNOSIS — C61 Malignant neoplasm of prostate: Secondary | ICD-10-CM | POA: Diagnosis not present

## 2019-07-06 DIAGNOSIS — N5231 Erectile dysfunction following radical prostatectomy: Secondary | ICD-10-CM | POA: Diagnosis not present

## 2019-07-06 DIAGNOSIS — N393 Stress incontinence (female) (male): Secondary | ICD-10-CM | POA: Diagnosis not present

## 2019-07-06 DIAGNOSIS — Z8546 Personal history of malignant neoplasm of prostate: Secondary | ICD-10-CM | POA: Diagnosis not present

## 2019-07-19 DIAGNOSIS — M62838 Other muscle spasm: Secondary | ICD-10-CM | POA: Diagnosis not present

## 2019-07-19 DIAGNOSIS — N393 Stress incontinence (female) (male): Secondary | ICD-10-CM | POA: Diagnosis not present

## 2019-07-19 DIAGNOSIS — M6281 Muscle weakness (generalized): Secondary | ICD-10-CM | POA: Diagnosis not present

## 2019-08-09 DIAGNOSIS — N393 Stress incontinence (female) (male): Secondary | ICD-10-CM | POA: Diagnosis not present

## 2019-08-09 DIAGNOSIS — M6281 Muscle weakness (generalized): Secondary | ICD-10-CM | POA: Diagnosis not present

## 2019-08-09 DIAGNOSIS — M62838 Other muscle spasm: Secondary | ICD-10-CM | POA: Diagnosis not present

## 2019-08-12 DIAGNOSIS — N393 Stress incontinence (female) (male): Secondary | ICD-10-CM | POA: Diagnosis not present

## 2019-08-17 ENCOUNTER — Other Ambulatory Visit: Payer: Self-pay | Admitting: Urology

## 2019-08-17 DIAGNOSIS — R310 Gross hematuria: Secondary | ICD-10-CM | POA: Diagnosis not present

## 2019-08-17 DIAGNOSIS — Z8546 Personal history of malignant neoplasm of prostate: Secondary | ICD-10-CM | POA: Diagnosis not present

## 2019-08-24 ENCOUNTER — Encounter (HOSPITAL_BASED_OUTPATIENT_CLINIC_OR_DEPARTMENT_OTHER): Payer: Self-pay | Admitting: Urology

## 2019-08-25 ENCOUNTER — Other Ambulatory Visit: Payer: Self-pay

## 2019-08-25 ENCOUNTER — Encounter (HOSPITAL_BASED_OUTPATIENT_CLINIC_OR_DEPARTMENT_OTHER): Payer: Self-pay | Admitting: Urology

## 2019-08-25 NOTE — Progress Notes (Signed)
NEW Covid Policy July 7035  Surgery Day:  09-02-2019  Facility:  [facility]  Type of Surgery:  Cysto w/ removal foreign body  Have you had Covid vaccine? YES  Fully Covid Vaccinated:   1) 05-03-2019                                          2) 05-31-2019                                          Where? [facility], [place] on  [address]                                           Type? Moderna  In the past 14 days:        Have you had any symptoms?  NO       Have you been tested covid positive? NO       Have you been in contact with someone covid positive? NO       Have you traveled internationally? NO        Is pt Immuno-compromised? NO

## 2019-08-25 NOTE — Progress Notes (Signed)
Spoke w/ via phone for pre-op interview--- PT Lab needs dos----   no            Lab results------ no COVID test ------ fully vaccinated, no test needed Pt aware to bring vaccine card dos for verification. Arrive at ------- 0930 NPO after MN NO Solid Food.  Clear liquids from MN until--- 0830 then nothing mouth Medications to take morning of surgery ----- NONE Diabetic medication ----- n/a Patient Special Instructions ----- n/a Pre-Op special Istructions ----- n/a Patient verbalized understanding of instructions that were given at this phone interview. Patient denies shortness of breath, chest pain, fever, cough a this phone interview.

## 2019-08-26 NOTE — Progress Notes (Signed)
Scheduled covid

## 2019-08-31 ENCOUNTER — Other Ambulatory Visit (HOSPITAL_COMMUNITY)
Admission: RE | Admit: 2019-08-31 | Discharge: 2019-08-31 | Disposition: A | Discharge status: Home / Self Care | Payer: BC Managed Care – PPO | Source: Ambulatory Visit | Attending: Urology | Admitting: Urology

## 2019-08-31 DIAGNOSIS — Z01812 Encounter for preprocedural laboratory examination: Secondary | ICD-10-CM | POA: Insufficient documentation

## 2019-08-31 DIAGNOSIS — Z20822 Contact with and (suspected) exposure to covid-19: Secondary | ICD-10-CM | POA: Insufficient documentation

## 2019-08-31 LAB — SARS CORONAVIRUS 2 (TAT 6-24 HRS): SARS Coronavirus 2: NEGATIVE

## 2019-09-02 ENCOUNTER — Other Ambulatory Visit: Payer: Self-pay

## 2019-09-02 ENCOUNTER — Encounter (HOSPITAL_BASED_OUTPATIENT_CLINIC_OR_DEPARTMENT_OTHER): Payer: Self-pay | Admitting: Urology

## 2019-09-02 ENCOUNTER — Ambulatory Visit (HOSPITAL_BASED_OUTPATIENT_CLINIC_OR_DEPARTMENT_OTHER)
Admission: RE | Admit: 2019-09-02 | Discharge: 2019-09-02 | Disposition: A | Discharge status: Home / Self Care | Payer: BC Managed Care – PPO | Attending: Urology | Admitting: Urology

## 2019-09-02 ENCOUNTER — Ambulatory Visit (HOSPITAL_BASED_OUTPATIENT_CLINIC_OR_DEPARTMENT_OTHER): Payer: BC Managed Care – PPO | Admitting: Anesthesiology

## 2019-09-02 ENCOUNTER — Encounter (HOSPITAL_BASED_OUTPATIENT_CLINIC_OR_DEPARTMENT_OTHER)
Admission: RE | Disposition: A | Discharge status: Home / Self Care | Payer: Self-pay | Source: Home / Self Care | Attending: Urology

## 2019-09-02 DIAGNOSIS — K219 Gastro-esophageal reflux disease without esophagitis: Secondary | ICD-10-CM | POA: Insufficient documentation

## 2019-09-02 DIAGNOSIS — T190XXA Foreign body in urethra, initial encounter: Secondary | ICD-10-CM | POA: Diagnosis not present

## 2019-09-02 DIAGNOSIS — C61 Malignant neoplasm of prostate: Secondary | ICD-10-CM

## 2019-09-02 DIAGNOSIS — X58XXXA Exposure to other specified factors, initial encounter: Secondary | ICD-10-CM | POA: Insufficient documentation

## 2019-09-02 DIAGNOSIS — Z87891 Personal history of nicotine dependence: Secondary | ICD-10-CM | POA: Diagnosis not present

## 2019-09-02 DIAGNOSIS — R319 Hematuria, unspecified: Secondary | ICD-10-CM | POA: Diagnosis not present

## 2019-09-02 DIAGNOSIS — N393 Stress incontinence (female) (male): Secondary | ICD-10-CM | POA: Insufficient documentation

## 2019-09-02 DIAGNOSIS — Z8546 Personal history of malignant neoplasm of prostate: Secondary | ICD-10-CM | POA: Diagnosis not present

## 2019-09-02 DIAGNOSIS — R310 Gross hematuria: Secondary | ICD-10-CM | POA: Insufficient documentation

## 2019-09-02 DIAGNOSIS — T191XXA Foreign body in bladder, initial encounter: Secondary | ICD-10-CM | POA: Diagnosis not present

## 2019-09-02 HISTORY — DX: Unspecified urinary incontinence: R32

## 2019-09-02 HISTORY — PX: FOREIGN BODY REMOVAL: SHX962

## 2019-09-02 SURGERY — FOREIGN BODY REMOVAL ADULT
Anesthesia: General | Site: Bladder

## 2019-09-02 MED ORDER — MIDAZOLAM HCL 5 MG/5ML IJ SOLN
INTRAMUSCULAR | Status: DC | PRN
  Administered 2019-09-02: 2 mg via INTRAVENOUS

## 2019-09-02 MED ORDER — TRAMADOL HCL 50 MG PO TABS: 50.0000 mg | ORAL_TABLET | Freq: Four times a day (QID) | ORAL | 0 refills | Status: DC | PRN

## 2019-09-02 MED ORDER — DEXAMETHASONE SODIUM PHOSPHATE 10 MG/ML IJ SOLN
INTRAMUSCULAR | Status: DC | PRN
  Administered 2019-09-02: 5 mg via INTRAVENOUS

## 2019-09-02 MED ORDER — PROPOFOL 10 MG/ML IV BOLUS
INTRAVENOUS | Status: DC | PRN
  Administered 2019-09-02: 200 mg via INTRAVENOUS

## 2019-09-02 MED ORDER — LACTATED RINGERS IV SOLN: INTRAVENOUS | Status: DC

## 2019-09-02 MED ORDER — FENTANYL CITRATE (PF) 100 MCG/2ML IJ SOLN
INTRAMUSCULAR | Status: DC | PRN
  Administered 2019-09-02: 25 ug via INTRAVENOUS
  Administered 2019-09-02: 50 ug via INTRAVENOUS
  Administered 2019-09-02: 25 ug via INTRAVENOUS

## 2019-09-02 MED ORDER — ONDANSETRON HCL 4 MG/2ML IJ SOLN
INTRAMUSCULAR | Status: AC
  Filled 2019-09-02: qty 2

## 2019-09-02 MED ORDER — DEXAMETHASONE SODIUM PHOSPHATE 10 MG/ML IJ SOLN
INTRAMUSCULAR | Status: AC
  Filled 2019-09-02: qty 1

## 2019-09-02 MED ORDER — MEPERIDINE HCL 25 MG/ML IJ SOLN: 6.2500 mg | INTRAMUSCULAR | Status: DC | PRN

## 2019-09-02 MED ORDER — PHENAZOPYRIDINE HCL 200 MG PO TABS
200.0000 mg | ORAL_TABLET | Freq: Three times a day (TID) | ORAL | 0 refills | Status: DC | PRN
Start: 2019-09-02 — End: 2021-06-21

## 2019-09-02 MED ORDER — FENTANYL CITRATE (PF) 100 MCG/2ML IJ SOLN: 25.0000 ug | INTRAMUSCULAR | Status: DC | PRN

## 2019-09-02 MED ORDER — LIDOCAINE 2% (20 MG/ML) 5 ML SYRINGE
INTRAMUSCULAR | Status: DC | PRN
  Administered 2019-09-02: 100 mg via INTRAVENOUS

## 2019-09-02 MED ORDER — CEFAZOLIN SODIUM-DEXTROSE 2-4 GM/100ML-% IV SOLN
INTRAVENOUS | Status: AC
  Filled 2019-09-02: qty 100

## 2019-09-02 MED ORDER — KETOROLAC TROMETHAMINE 30 MG/ML IJ SOLN
INTRAMUSCULAR | Status: AC
  Filled 2019-09-02: qty 1

## 2019-09-02 MED ORDER — FENTANYL CITRATE (PF) 100 MCG/2ML IJ SOLN
INTRAMUSCULAR | Status: AC
  Filled 2019-09-02: qty 2

## 2019-09-02 MED ORDER — ACETAMINOPHEN 160 MG/5ML PO SOLN: 325.0000 mg | ORAL | Status: DC | PRN

## 2019-09-02 MED ORDER — MIDAZOLAM HCL 2 MG/2ML IJ SOLN
INTRAMUSCULAR | Status: AC
  Filled 2019-09-02: qty 2

## 2019-09-02 MED ORDER — OXYCODONE HCL 5 MG PO TABS: 5.0000 mg | ORAL_TABLET | Freq: Once | ORAL | Status: DC | PRN

## 2019-09-02 MED ORDER — KETOROLAC TROMETHAMINE 30 MG/ML IJ SOLN
INTRAMUSCULAR | Status: DC | PRN
Start: 2019-09-02 — End: 2019-09-02
  Administered 2019-09-02: 15 mg via INTRAVENOUS

## 2019-09-02 MED ORDER — CEFAZOLIN SODIUM-DEXTROSE 2-4 GM/100ML-% IV SOLN
2.0000 g | INTRAVENOUS | Status: AC
  Administered 2019-09-02: 2 g via INTRAVENOUS

## 2019-09-02 MED ORDER — ONDANSETRON HCL 4 MG/2ML IJ SOLN: 4.0000 mg | Freq: Once | INTRAMUSCULAR | Status: DC | PRN

## 2019-09-02 MED ORDER — OXYCODONE HCL 5 MG/5ML PO SOLN: 5.0000 mg | Freq: Once | ORAL | Status: DC | PRN

## 2019-09-02 MED ORDER — PROPOFOL 10 MG/ML IV BOLUS
INTRAVENOUS | Status: AC
  Filled 2019-09-02: qty 20

## 2019-09-02 MED ORDER — LIDOCAINE 2% (20 MG/ML) 5 ML SYRINGE
INTRAMUSCULAR | Status: AC
  Filled 2019-09-02: qty 5

## 2019-09-02 MED ORDER — STERILE WATER FOR IRRIGATION IR SOLN
Status: DC | PRN
  Administered 2019-09-02: 3000 mL via INTRAVESICAL

## 2019-09-02 MED ORDER — ACETAMINOPHEN 325 MG PO TABS: 325.0000 mg | ORAL_TABLET | ORAL | Status: DC | PRN

## 2019-09-02 MED ORDER — ONDANSETRON HCL 4 MG/2ML IJ SOLN
INTRAMUSCULAR | Status: DC | PRN
  Administered 2019-09-02: 4 mg via INTRAVENOUS

## 2019-09-02 SURGICAL SUPPLY — 18 items
BAG DRAIN URO-CYSTO SKYTR STRL (DRAIN) ×2 IMPLANT
BAG DRN RND TRDRP ANRFLXCHMBR (UROLOGICAL SUPPLIES) ×1
BAG DRN UROCATH (DRAIN) ×1
BAG URINE DRAIN 2000ML AR STRL (UROLOGICAL SUPPLIES) ×2 IMPLANT
CATH FOLEY 2WAY SLVR  5CC 18FR (CATHETERS) ×2
CATH FOLEY 2WAY SLVR 5CC 18FR (CATHETERS) ×1 IMPLANT
CLOTH BEACON ORANGE TIMEOUT ST (SAFETY) ×2 IMPLANT
ELECT REM PT RETURN 9FT ADLT (ELECTROSURGICAL)
ELECTRODE REM PT RTRN 9FT ADLT (ELECTROSURGICAL) IMPLANT
GLOVE BIO SURGEON STRL SZ7.5 (GLOVE) ×2 IMPLANT
GOWN STRL REUS W/ TWL XL LVL3 (GOWN DISPOSABLE) ×1 IMPLANT
GOWN STRL REUS W/TWL XL LVL3 (GOWN DISPOSABLE) ×2
KIT TURNOVER CYSTO (KITS) ×2 IMPLANT
MANIFOLD NEPTUNE II (INSTRUMENTS) ×2 IMPLANT
NS IRRIG 500ML POUR BTL (IV SOLUTION) IMPLANT
PACK CYSTO (CUSTOM PROCEDURE TRAY) ×2 IMPLANT
TUBE CONNECTING 12X1/4 (SUCTIONS) ×2 IMPLANT
WATER STERILE IRR 3000ML UROMA (IV SOLUTION) ×2 IMPLANT

## 2019-09-02 NOTE — Op Note (Signed)
Preoperative diagnosis:  1. Hematuria 2. URethral Foreign body   Postoperative diagnosis:  1. same   Procedure: 1. Cystoscopy 2. Removal of foreign body  Surgeon: Ardis Hughs, MD  Anesthesia: General  Complications: None  Intraoperative findings: Several small suture ends with stones within the loop around the bladder neck  EBL: Minimal  Specimens: None  Indication: Adam Ramos is a 59 y.o. patient with h/o of RALP who developed hematuria and was noted to have calcifications within his urethra from suture.  After reviewing the management options for treatment, he elected to proceed with the above surgical procedure(s). We have discussed the potential benefits and risks of the procedure, side effects of the proposed treatment, the likelihood of the patient achieving the goals of the procedure, and any potential problems that might occur during the procedure or recuperation. Informed consent has been obtained.  Description of procedure:  The patient was taken to the operating room and general anesthesia was induced.  The patient was placed in the dorsal lithotomy position, prepped and draped in the usual sterile fashion, and preoperative antibiotics were administered. A preoperative time-out was performed.   Using a 30 deg cystoscope I slowly navigated through his urethral and into his bladder.  His bladder was normal appearing orthotopic ureters.  At the bladder neck and proximal urethra there was several calcifications within the suture ends, which was was able to mostly remove using the beak of the scope.  I did also use the rigid biopsy graspers for the anterior stone/stitch.  There was mild bleeding at the anterior aspect of the urethra.  I empty the bladder, removed the scope and placed a 77F foley catheter.  HE was extubated and return to the PACU in stable condition.  Ardis Hughs, M.D.

## 2019-09-02 NOTE — Interval H&P Note (Signed)
History and Physical Interval Note:  09/02/2019 11:03 AM  Adam Ramos  has presented today for surgery, with the diagnosis of HEMATURIA PERINEAL PAIN.  The various methods of treatment have been discussed with the patient and family. After consideration of risks, benefits and other options for treatment, the patient has consented to  Procedure(s): [place] (N/A) as a surgical intervention.  The patient's history has been reviewed, patient examined, no change in status, stable for surgery.  I have reviewed the patient's chart and labs.  Questions were answered to the patient's satisfaction.     Ardis Hughs

## 2019-09-02 NOTE — Anesthesia Procedure Notes (Signed)
Procedure Name: LMA Insertion Date/Time: 09/02/2019 11:09 AM Performed by: Gwyndolyn Saxon, CRNA Pre-anesthesia Checklist: Patient identified, Emergency Drugs available, Suction available and Patient being monitored Patient Re-evaluated:Patient Re-evaluated prior to induction Oxygen Delivery Method: Circle system utilized Preoxygenation: Pre-oxygenation with 100% oxygen Induction Type: IV induction Ventilation: Mask ventilation without difficulty LMA: LMA inserted LMA Size: 5.0 Number of attempts: 1 Placement Confirmation: positive ETCO2 and breath sounds checked- equal and bilateral Tube secured with: Tape Dental Injury: Teeth and Oropharynx as per pre-operative assessment

## 2019-09-02 NOTE — Discharge Instructions (Signed)
  Post Anesthesia Home Care Instructions  Activity: Get plenty of rest for the remainder of the day. A responsible adult should stay with you for 24 hours following the procedure.  For the next 24 hours, DO NOT: -Drive a car -Paediatric nurse -Drink alcoholic beverages -Take any medication unless instructed by your physician -Make any legal decisions or sign important papers.  Meals: Start with liquid foods such as gelatin or soup. Progress to regular foods as tolerated. Avoid greasy, spicy, heavy foods. If nausea and/or vomiting occur, drink only clear liquids until the nausea and/or vomiting subsides. Call your physician if vomiting continues.  Special Instructions/Symptoms: Your throat may feel dry or sore from the anesthesia or the breathing tube placed in your throat during surgery. If this causes discomfort, gargle with warm salt water. The discomfort should disappear within 24 hours.  If you had a scopolamine patch placed behind your ear for the management of post- operative nausea and/or vomiting:  1. The medication in the patch is effective for 72 hours, after which it should be removed.  Wrap patch in a tissue and discard in the trash. Wash hands thoroughly with soap and water. 2. You may remove the patch earlier than 72 hours if you experience unpleasant side effects which may include dry mouth, dizziness or visual disturbances. 3. Avoid touching the patch. Wash your hands with soap and water after contact with the patch.   CYSTOSCOPY HOME CARE INSTRUCTIONS  Activity: Rest for the remainder of the day.  Do not drive or operate equipment today.  You may resume normal activities in one to two days as instructed by your physician.   Meals: Drink plenty of liquids and eat light foods such as gelatin or soup this evening.  You may return to a normal meal plan tomorrow.  Return to Work: You may return to work in one to two days or as instructed by your physician.  Special  Instructions / Symptoms: Call your physician if any of these symptoms occur:   -persistent or heavy bleeding  -bleeding which continues after first few urination  -large blood clots that are difficult to pass  -urine stream diminishes or stops completely  -fever equal to or higher than 101 degrees Farenheit.  -cloudy urine with a strong, foul odor  -severe pain

## 2019-09-02 NOTE — H&P (Signed)
f/u to monitor Prostate Cancer  HPI: Adam Ramos is a 59 year-old male established patient who is here for interval evaluation of his prostate cancer.  Interval 08/12/19: Patient with below noted history. He presents today with complaints of gross hematuria. He states that this is been ongoing for the past several days. He has noted some intermittent passage of small clot material, but denies any difficulties voiding. He continues to feel he is emptying adequately. He continues to have ongoing issues with stress urinary incontinence and continues to use 2-3 pads per day. He complains of perineal pain, which has been ongoing since his procedure. He denies any current dysuria. He denies any unilateral flank pain consistent with his past history of stones. No complaints of nausea, vomiting, fever, or chills.   08/17/19: Patient with above-noted history. He returns today for ongoing follow-up with flexible cystoscopy. Urine culture from prior office visit was noted to be negative. States that gross hematuria has now resolved. However, he continues to complain of ongoing perineal pain. He denies dysuria. He continues to have persistent stress urinary incontinence. No complaints of fever, chills, nausea, or vomiting.   He was diagnosed with prostate cancer in approximately 03/15/2019. The patient was treated with a radical prostatectomy. He underwent a unilateral nerve spare approach. The patient started/underwent treatemt on 05/17/19. The patient's gleason score was: T3 Gleason 4+4=8, ECE on left. Pretreatment PSA: 12.5.   The patient's most recent PSA was <0.015. This was drawn on approximately 06/29/2019.   Surgical Margins: negative. There were 0/13 positive nodes.   He does have problems with erections. His erections are not satisfactory for intercouse.   The patient is having urinary incontinence. He wears 2-3 pads per day to manage his incontinence. The patient denies any new bone pain, new back pain, or  lower extremity edema. The patient has developed incontinence.     ALLERGIES: No Allergies    MEDICATIONS: Stool Softener  Tums  Tylenol     GU PSH: Cystoscopy Insert Stent, Left - 03/06/2018 ESWL - 2018, 2016, 2013, 2013 Laparoscopy; Lymphadenectomy - 05/17/2019 Prostate Needle Biopsy - 03/22/2019 Robotic Radical Prostatectomy - 05/17/2019 Ureteroscopic laser litho, Left - 02/19/2018 Ureteroscopic stone removal, Left - 03/06/2018       PSH Notes: Lithotripsy, Lithotripsy, Bladder Cystotomy With Basket Extraction Of Calculus, Shoulder Surgery Right, Lithotripsy, root canal   NON-GU PSH: Remove Ureter Calculus - 2013 Surgical Pathology, Gross And Microscopic Examination For Prostate Needle - 03/22/2019     GU PMH: History of prostate cancer - 08/12/2019, - 07/06/2019 Stress Incontinence - 08/09/2019, - 07/19/2019 (Stable), - 07/06/2019, - 06/29/2019, - 06/14/2019, - 05/11/2019 ED following radical prostatectomy - 07/06/2019 Prostate Cancer - 03/29/2019 Elevated PSA - 02/09/2019, - 11/10/2018, - 09/21/2018 Acute Cystitis/UTI - 11/10/2018 Acute prostatitis - 11/10/2018 Prostate nodule w/o LUTS - 11/10/2018 Pelvic/perineal pain - 09/21/2018 Gross hematuria - 09/14/2018 Painful micturition, Unspec - 09/14/2018 Hydronephrosis - 06/30/2018 Renal calculus - 04/02/2018, - 01/16/2018, - 2019, - 2018, Nephrolithiasis, - 2014 Renal and ureteral calculus, Right - 2018 Low back pain, Lumbago - 2017 Ureteral calculus, Left ureteral calculus - 2016, Calculus of ureter, - 2015 Abdominal Pain Unspec, Right flank pain - 2015 Other microscopic hematuria, Microscopic hematuria - 2015    NON-GU PMH: Muscle weakness (generalized) - 08/09/2019, - 07/19/2019, - 06/29/2019, - 06/14/2019, - 05/11/2019, - 04/23/2019 Other muscle spasm - 08/09/2019, - 07/19/2019, - 06/29/2019, - 06/14/2019, - 05/11/2019, - 04/23/2019 Other specified disorders of muscle - 04/23/2019 Encounter for general adult medical  examination without abnormal findings, Encounter  for preventive health examination - 2015 Nausea, Nausea - 2015 Hypercalciuria, Hypercalciuria - 2014 Personal history of other diseases of the digestive system, History of esophageal reflux - 2014    FAMILY HISTORY: Family Health Status Of Mother - Alive 93 - Runs In Family Stroke Syndrome - Father   SOCIAL HISTORY: Marital Status: Single Preferred Language: English; Ethnicity: Not Hispanic Or Latino; Race: White Current Smoking Status: Patient does not smoke anymore. Has not smoked since 02/12/1983.   Tobacco Use Assessment Completed: Used Tobacco in last 30 days? Social Drinker.  Drinks 3 caffeinated drinks per day.     Notes: Former smoker, Occupation:, Caffeine Use, Marital History - Single, Alcohol Use   REVIEW OF SYSTEMS:    GU Review Male:   Patient denies frequent urination, hard to postpone urination, burning/ pain with urination, get up at night to urinate, leakage of urine, stream starts and stops, trouble starting your stream, have to strain to urinate , erection problems, and penile pain.  Gastrointestinal (Upper):   Patient denies nausea, vomiting, and indigestion/ heartburn.  Gastrointestinal (Lower):   Patient denies diarrhea and constipation.  Constitutional:   Patient denies fever, night sweats, weight loss, and fatigue.  Skin:   Patient denies skin rash/ lesion and itching.  Eyes:   Patient denies double vision and blurred vision.  Ears/ Nose/ Throat:   Patient denies sore throat and sinus problems.  Hematologic/Lymphatic:   Patient denies swollen glands and easy bruising.  Cardiovascular:   Patient denies leg swelling and chest pains.  Respiratory:   Patient denies cough and shortness of breath.  Endocrine:   Patient denies excessive thirst.  Musculoskeletal:   Patient denies back pain and joint pain.  Neurological:   Patient denies headaches and dizziness.  Psychologic:   Patient denies depression and anxiety.   VITAL SIGNS:      08/17/2019 11:49 AM  Weight 215  lb / 97.52 kg  Height 72 in / 182.88 cm  BP 133/82 mmHg  Pulse 51 /min  Temperature 97.3 F / 36.2 C  BMI 29.2 kg/m   GU PHYSICAL EXAMINATION:    Urethral Meatus: Normal size. No lesion, no wart, no polyp, no balanitis, no discharge. Normal location.   Prostate: Prostate surgically absent.  Seminal Vesicles: Prostate surgically absent.   MULTI-SYSTEM PHYSICAL EXAMINATION:    Constitutional: Well-nourished. No physical deformities. Normally developed. Good grooming.  Neurologic / Psychiatric: Oriented to time, oriented to place, oriented to person. No depression, no anxiety, no agitation.  Musculoskeletal: Normal gait and station of head and neck.     Complexity of Data:  Lab Test Review:   PSA  Records Review:   Pathology Reports, Previous Patient Records  Urine Test Review:   Urinalysis, Urine Culture   06/29/19 02/04/19 11/06/18  PSA  Total PSA <0.015 ng/mL 12.50 ng/mL 10.70 ng/mL    PROCEDURES:         Flexible Cystoscopy - 52000  Risks, benefits, and some of the potential complications of the procedure were discussed at length with the patient including infection, bleeding, voiding discomfort, urinary retention, fever, chills, sepsis, and others. All questions were answered. Informed consent was obtained. Antibiotic prophylaxis was given. Sterile technique and intraurethral analgesia were used.  Meatus:  Normal size. Normal location. Normal condition.  Urethra:  No strictures.  External Sphincter:  Normal.  Verumontanum:  Verumontanum Surgically Absent.  Prostate:  Prostate Surgically Absent.  Bladder Neck:  Non-obstructing. Just prior to the bladder  neck, there appeared to be suture with small calculus noted within the urethra. This did not appear to be obstruction. No obvious bleeding noted.   Bladder:  No trabeculation. No tumors. Normal mucosa. No stones.      The lower urinary tract was carefully examined. The procedure was well-tolerated and without complications.  Antibiotic instructions were given. Instructions were given to call the office immediately for bloody urine, difficulty urinating, urinary retention, painful or frequent urination, fever, chills, nausea, vomiting or other illness. The patient stated that he understood these instructions and would comply with them.         Urinalysis - 81003 Dipstick Dipstick Cont'd  Color: Yellow Bilirubin: Neg  Appearance: Clear Ketones: Neg  Specific Gravity: 1.020 Blood: Neg  pH: 5.5 Protein: Neg  Glucose: Neg Urobilinogen: 0.2    Nitrites: Neg    Leukocyte Esterase: Neg    Notes:      ASSESSMENT:      ICD-10 Details  1 GU:   History of prostate cancer - Z85.46   2   Gross hematuria - R31.0    PLAN:           Document Letter(s):  Created for Patient: Clinical Summary         Notes:   Flexible cystoscopy today revealed material consistent with suture material within the urethra. There was a small calculus adherent to this. There did not appear to be any degree of obstruction, however. No obvious bleeding was noted. We discussed that based on findings he would likely need formal cystoscopy in the operating room setting with removal of noted foreign body. We reviewed this procedure and the potential risk in detail today. He would like to proceed. We discussed that findings may be attributing to his pain, but we also reviewed that he may continue to have persistent pain despite treatment. He was given ciprofloxacin 500 mg x 1 for prophylactic coverage of today's procedure. Case reviewed with his urologist and will plan to get him scheduled next available. Return precautions reviewed in the interim.

## 2019-09-02 NOTE — Transfer of Care (Signed)
Immediate Anesthesia Transfer of Care Note  Patient: Adam Ramos  Procedure(s) Performed: CYSTOSCOPY FOREIGN BODY REMOVAL ADULT (N/A Bladder)  Patient Location: PACU  Anesthesia Type:General  Level of Consciousness: drowsy  Airway & Oxygen Therapy: Patient Spontanous Breathing and Patient connected to face mask oxygen  Post-op Assessment: Report given to RN and Post -op Vital signs reviewed and stable  Post vital signs: Reviewed and stable  Last Vitals:  Vitals Value Taken Time  BP 102/70 09/02/19 1148  Temp    Pulse 55 09/02/19 1150  Resp 13 09/02/19 1150  SpO2 98 % 09/02/19 1150  Vitals shown include unvalidated device data.  Last Pain:  Vitals:   09/02/19 0954  TempSrc: Oral  PainSc: 2       Patients Stated Pain Goal: 5 (35/07/57 3225)  Complications: No complications documented.

## 2019-09-02 NOTE — Anesthesia Preprocedure Evaluation (Addendum)
Anesthesia Evaluation  Patient identified by MRN, date of birth, ID band Patient awake    Reviewed: Allergy & Precautions, NPO status , Patient's Chart, lab work & pertinent test results  Airway Mallampati: I  TM Distance: >3 FB Neck ROM: Full    Dental no notable dental hx. (+) Teeth Intact, Caps,    Pulmonary former smoker,    Pulmonary exam normal breath sounds clear to auscultation       Cardiovascular Normal cardiovascular exam Rhythm:Regular Rate:Normal     Neuro/Psych    GI/Hepatic GERD  ,  Endo/Other    Renal/GU Renal disease     Musculoskeletal   Abdominal   Peds  Hematology   Anesthesia Other Findings   Reproductive/Obstetrics                             Anesthesia Physical  Anesthesia Plan  ASA: II  Anesthesia Plan: General   Post-op Pain Management:    Induction: Intravenous  PONV Risk Score and Plan: 2 and Ondansetron, Dexamethasone, Midazolam and Treatment may vary due to age or medical condition  Airway Management Planned: Oral ETT and LMA  Additional Equipment:   Intra-op Plan:   Post-operative Plan: Extubation in OR  Informed Consent: I have reviewed the patients History and Physical, chart, labs and discussed the procedure including the risks, benefits and alternatives for the proposed anesthesia with the patient or authorized representative who has indicated his/her understanding and acceptance.     Dental advisory given  Plan Discussed with: CRNA, Anesthesiologist and Surgeon  Anesthesia Plan Comments: ( )        Anesthesia Quick Evaluation

## 2019-09-03 ENCOUNTER — Encounter (HOSPITAL_BASED_OUTPATIENT_CLINIC_OR_DEPARTMENT_OTHER): Payer: Self-pay | Admitting: Urology

## 2019-09-03 NOTE — Anesthesia Postprocedure Evaluation (Signed)
Anesthesia Post Note  Patient: Clemon C Martinique  Procedure(s) Performed: CYSTOSCOPY FOREIGN BODY REMOVAL ADULT (N/A Bladder)     Patient location during evaluation: PACU Anesthesia Type: General Level of consciousness: awake and alert Pain management: pain level controlled Vital Signs Assessment: post-procedure vital signs reviewed and stable Respiratory status: spontaneous breathing, nonlabored ventilation, respiratory function stable and patient connected to nasal cannula oxygen Cardiovascular status: blood pressure returned to baseline and stable Postop Assessment: no apparent nausea or vomiting Anesthetic complications: no   No complications documented.  Last Vitals:  Vitals:   09/02/19 1246 09/02/19 1330  BP: 108/76 121/85  Pulse: 56 62  Resp: 12 16  Temp:  36.6 C  SpO2: 96% 98%    Last Pain:  Vitals:   09/03/19 0957  TempSrc:   PainSc: 0-No pain                 Luria Rosario

## 2019-09-06 DIAGNOSIS — N393 Stress incontinence (female) (male): Secondary | ICD-10-CM | POA: Diagnosis not present

## 2019-09-06 DIAGNOSIS — M6281 Muscle weakness (generalized): Secondary | ICD-10-CM | POA: Diagnosis not present

## 2019-09-06 DIAGNOSIS — M62838 Other muscle spasm: Secondary | ICD-10-CM | POA: Diagnosis not present

## 2019-09-17 DIAGNOSIS — Z20822 Contact with and (suspected) exposure to covid-19: Secondary | ICD-10-CM | POA: Diagnosis not present

## 2019-09-17 DIAGNOSIS — Z03818 Encounter for observation for suspected exposure to other biological agents ruled out: Secondary | ICD-10-CM | POA: Diagnosis not present

## 2019-10-04 DIAGNOSIS — M62838 Other muscle spasm: Secondary | ICD-10-CM | POA: Diagnosis not present

## 2019-10-04 DIAGNOSIS — M6281 Muscle weakness (generalized): Secondary | ICD-10-CM | POA: Diagnosis not present

## 2019-10-04 DIAGNOSIS — N393 Stress incontinence (female) (male): Secondary | ICD-10-CM | POA: Diagnosis not present

## 2019-10-11 DIAGNOSIS — Z23 Encounter for immunization: Secondary | ICD-10-CM | POA: Diagnosis not present

## 2019-10-11 DIAGNOSIS — Z0000 Encounter for general adult medical examination without abnormal findings: Secondary | ICD-10-CM | POA: Diagnosis not present

## 2019-10-11 DIAGNOSIS — R609 Edema, unspecified: Secondary | ICD-10-CM | POA: Diagnosis not present

## 2019-10-11 DIAGNOSIS — R5383 Other fatigue: Secondary | ICD-10-CM | POA: Diagnosis not present

## 2019-11-02 DIAGNOSIS — Z8546 Personal history of malignant neoplasm of prostate: Secondary | ICD-10-CM | POA: Diagnosis not present

## 2019-11-02 DIAGNOSIS — N393 Stress incontinence (female) (male): Secondary | ICD-10-CM | POA: Diagnosis not present

## 2019-11-02 DIAGNOSIS — M62838 Other muscle spasm: Secondary | ICD-10-CM | POA: Diagnosis not present

## 2019-11-02 DIAGNOSIS — M6281 Muscle weakness (generalized): Secondary | ICD-10-CM | POA: Diagnosis not present

## 2019-11-08 DIAGNOSIS — Z8546 Personal history of malignant neoplasm of prostate: Secondary | ICD-10-CM | POA: Diagnosis not present

## 2019-11-08 DIAGNOSIS — N5231 Erectile dysfunction following radical prostatectomy: Secondary | ICD-10-CM | POA: Diagnosis not present

## 2019-11-08 DIAGNOSIS — N393 Stress incontinence (female) (male): Secondary | ICD-10-CM | POA: Diagnosis not present

## 2019-12-02 DIAGNOSIS — M6281 Muscle weakness (generalized): Secondary | ICD-10-CM | POA: Diagnosis not present

## 2019-12-02 DIAGNOSIS — N393 Stress incontinence (female) (male): Secondary | ICD-10-CM | POA: Diagnosis not present

## 2019-12-02 DIAGNOSIS — M62838 Other muscle spasm: Secondary | ICD-10-CM | POA: Diagnosis not present

## 2019-12-17 DIAGNOSIS — Z23 Encounter for immunization: Secondary | ICD-10-CM | POA: Diagnosis not present

## 2020-01-12 DIAGNOSIS — M62838 Other muscle spasm: Secondary | ICD-10-CM | POA: Diagnosis not present

## 2020-01-12 DIAGNOSIS — N393 Stress incontinence (female) (male): Secondary | ICD-10-CM | POA: Diagnosis not present

## 2020-01-12 DIAGNOSIS — M6281 Muscle weakness (generalized): Secondary | ICD-10-CM | POA: Diagnosis not present

## 2020-02-29 DIAGNOSIS — Z8546 Personal history of malignant neoplasm of prostate: Secondary | ICD-10-CM | POA: Diagnosis not present

## 2020-02-29 DIAGNOSIS — N393 Stress incontinence (female) (male): Secondary | ICD-10-CM | POA: Diagnosis not present

## 2020-02-29 DIAGNOSIS — M62838 Other muscle spasm: Secondary | ICD-10-CM | POA: Diagnosis not present

## 2020-02-29 DIAGNOSIS — M6281 Muscle weakness (generalized): Secondary | ICD-10-CM | POA: Diagnosis not present

## 2020-03-07 DIAGNOSIS — N393 Stress incontinence (female) (male): Secondary | ICD-10-CM | POA: Diagnosis not present

## 2020-03-07 DIAGNOSIS — R600 Localized edema: Secondary | ICD-10-CM | POA: Diagnosis not present

## 2020-03-07 DIAGNOSIS — N5231 Erectile dysfunction following radical prostatectomy: Secondary | ICD-10-CM | POA: Diagnosis not present

## 2020-03-07 DIAGNOSIS — Z8546 Personal history of malignant neoplasm of prostate: Secondary | ICD-10-CM | POA: Diagnosis not present

## 2020-03-27 DIAGNOSIS — Z8546 Personal history of malignant neoplasm of prostate: Secondary | ICD-10-CM | POA: Diagnosis not present

## 2020-03-28 DIAGNOSIS — C688 Malignant neoplasm of overlapping sites of urinary organs: Secondary | ICD-10-CM | POA: Diagnosis not present

## 2020-03-28 DIAGNOSIS — R600 Localized edema: Secondary | ICD-10-CM | POA: Diagnosis not present

## 2020-03-28 DIAGNOSIS — M47816 Spondylosis without myelopathy or radiculopathy, lumbar region: Secondary | ICD-10-CM | POA: Diagnosis not present

## 2020-03-28 DIAGNOSIS — Z8546 Personal history of malignant neoplasm of prostate: Secondary | ICD-10-CM | POA: Diagnosis not present

## 2020-03-28 DIAGNOSIS — N3289 Other specified disorders of bladder: Secondary | ICD-10-CM | POA: Diagnosis not present

## 2020-03-30 ENCOUNTER — Other Ambulatory Visit (HOSPITAL_COMMUNITY): Payer: Self-pay | Admitting: Urology

## 2020-03-30 ENCOUNTER — Other Ambulatory Visit: Payer: Self-pay

## 2020-03-30 ENCOUNTER — Ambulatory Visit (HOSPITAL_COMMUNITY)
Admission: RE | Admit: 2020-03-30 | Discharge: 2020-03-30 | Disposition: A | Discharge status: Home / Self Care | Payer: BC Managed Care – PPO | Source: Ambulatory Visit | Attending: Urology | Admitting: Urology

## 2020-03-30 DIAGNOSIS — R609 Edema, unspecified: Secondary | ICD-10-CM | POA: Diagnosis not present

## 2020-03-30 NOTE — Progress Notes (Signed)
Lower extremity venous has been completed.   Preliminary results in CV Proc.   Abram Sander 03/30/2020 3:14 PM

## 2020-04-20 DIAGNOSIS — N393 Stress incontinence (female) (male): Secondary | ICD-10-CM | POA: Diagnosis not present

## 2020-04-20 DIAGNOSIS — M6281 Muscle weakness (generalized): Secondary | ICD-10-CM | POA: Diagnosis not present

## 2020-04-20 DIAGNOSIS — M62838 Other muscle spasm: Secondary | ICD-10-CM | POA: Diagnosis not present

## 2020-04-28 DIAGNOSIS — C61 Malignant neoplasm of prostate: Secondary | ICD-10-CM | POA: Diagnosis not present

## 2020-04-28 DIAGNOSIS — N3000 Acute cystitis without hematuria: Secondary | ICD-10-CM | POA: Diagnosis not present

## 2020-04-28 DIAGNOSIS — B9620 Unspecified Escherichia coli [E. coli] as the cause of diseases classified elsewhere: Secondary | ICD-10-CM | POA: Diagnosis not present

## 2020-04-28 DIAGNOSIS — N390 Urinary tract infection, site not specified: Secondary | ICD-10-CM | POA: Diagnosis not present

## 2020-05-16 DIAGNOSIS — N390 Urinary tract infection, site not specified: Secondary | ICD-10-CM | POA: Diagnosis not present

## 2020-05-16 DIAGNOSIS — B9620 Unspecified Escherichia coli [E. coli] as the cause of diseases classified elsewhere: Secondary | ICD-10-CM | POA: Diagnosis not present

## 2020-08-02 DIAGNOSIS — K648 Other hemorrhoids: Secondary | ICD-10-CM | POA: Diagnosis not present

## 2020-08-02 DIAGNOSIS — Z1211 Encounter for screening for malignant neoplasm of colon: Secondary | ICD-10-CM | POA: Diagnosis not present

## 2020-08-02 DIAGNOSIS — K635 Polyp of colon: Secondary | ICD-10-CM | POA: Diagnosis not present

## 2020-08-02 DIAGNOSIS — K644 Residual hemorrhoidal skin tags: Secondary | ICD-10-CM | POA: Diagnosis not present

## 2020-08-02 DIAGNOSIS — K5730 Diverticulosis of large intestine without perforation or abscess without bleeding: Secondary | ICD-10-CM | POA: Diagnosis not present

## 2020-08-02 DIAGNOSIS — D125 Benign neoplasm of sigmoid colon: Secondary | ICD-10-CM | POA: Diagnosis not present

## 2020-08-02 DIAGNOSIS — D124 Benign neoplasm of descending colon: Secondary | ICD-10-CM | POA: Diagnosis not present

## 2020-09-12 DIAGNOSIS — Z8546 Personal history of malignant neoplasm of prostate: Secondary | ICD-10-CM | POA: Diagnosis not present

## 2020-09-19 DIAGNOSIS — N5231 Erectile dysfunction following radical prostatectomy: Secondary | ICD-10-CM | POA: Diagnosis not present

## 2020-09-19 DIAGNOSIS — N393 Stress incontinence (female) (male): Secondary | ICD-10-CM | POA: Diagnosis not present

## 2020-09-19 DIAGNOSIS — Z8546 Personal history of malignant neoplasm of prostate: Secondary | ICD-10-CM | POA: Diagnosis not present

## 2020-09-19 DIAGNOSIS — R600 Localized edema: Secondary | ICD-10-CM | POA: Diagnosis not present

## 2020-10-18 DIAGNOSIS — Z13220 Encounter for screening for lipoid disorders: Secondary | ICD-10-CM | POA: Diagnosis not present

## 2020-10-18 DIAGNOSIS — Z131 Encounter for screening for diabetes mellitus: Secondary | ICD-10-CM | POA: Diagnosis not present

## 2020-10-18 DIAGNOSIS — Z0000 Encounter for general adult medical examination without abnormal findings: Secondary | ICD-10-CM | POA: Diagnosis not present

## 2021-03-12 DIAGNOSIS — C61 Malignant neoplasm of prostate: Secondary | ICD-10-CM | POA: Diagnosis not present

## 2021-03-19 DIAGNOSIS — N5231 Erectile dysfunction following radical prostatectomy: Secondary | ICD-10-CM | POA: Diagnosis not present

## 2021-03-19 DIAGNOSIS — Z8546 Personal history of malignant neoplasm of prostate: Secondary | ICD-10-CM | POA: Diagnosis not present

## 2021-03-19 DIAGNOSIS — N393 Stress incontinence (female) (male): Secondary | ICD-10-CM | POA: Diagnosis not present

## 2021-03-19 DIAGNOSIS — B9620 Unspecified Escherichia coli [E. coli] as the cause of diseases classified elsewhere: Secondary | ICD-10-CM | POA: Diagnosis not present

## 2021-03-19 DIAGNOSIS — N200 Calculus of kidney: Secondary | ICD-10-CM | POA: Diagnosis not present

## 2021-03-19 DIAGNOSIS — N390 Urinary tract infection, site not specified: Secondary | ICD-10-CM | POA: Diagnosis not present

## 2021-04-12 DIAGNOSIS — R8271 Bacteriuria: Secondary | ICD-10-CM | POA: Diagnosis not present

## 2021-04-12 DIAGNOSIS — R350 Frequency of micturition: Secondary | ICD-10-CM | POA: Diagnosis not present

## 2021-04-12 DIAGNOSIS — N3946 Mixed incontinence: Secondary | ICD-10-CM | POA: Diagnosis not present

## 2021-04-24 DIAGNOSIS — N3946 Mixed incontinence: Secondary | ICD-10-CM | POA: Diagnosis not present

## 2021-05-01 DIAGNOSIS — N390 Urinary tract infection, site not specified: Secondary | ICD-10-CM | POA: Diagnosis not present

## 2021-05-01 DIAGNOSIS — N3000 Acute cystitis without hematuria: Secondary | ICD-10-CM | POA: Diagnosis not present

## 2021-05-01 DIAGNOSIS — B9620 Unspecified Escherichia coli [E. coli] as the cause of diseases classified elsewhere: Secondary | ICD-10-CM | POA: Diagnosis not present

## 2021-05-11 DIAGNOSIS — N3000 Acute cystitis without hematuria: Secondary | ICD-10-CM | POA: Diagnosis not present

## 2021-05-11 DIAGNOSIS — N3946 Mixed incontinence: Secondary | ICD-10-CM | POA: Diagnosis not present

## 2021-05-16 DIAGNOSIS — N211 Calculus in urethra: Secondary | ICD-10-CM | POA: Diagnosis not present

## 2021-05-16 DIAGNOSIS — N210 Calculus in bladder: Secondary | ICD-10-CM | POA: Diagnosis not present

## 2021-06-14 DIAGNOSIS — R109 Unspecified abdominal pain: Secondary | ICD-10-CM | POA: Diagnosis not present

## 2021-06-14 DIAGNOSIS — K921 Melena: Secondary | ICD-10-CM | POA: Diagnosis not present

## 2021-06-17 ENCOUNTER — Emergency Department (HOSPITAL_COMMUNITY): Payer: BC Managed Care – PPO

## 2021-06-17 ENCOUNTER — Other Ambulatory Visit: Payer: Self-pay

## 2021-06-17 ENCOUNTER — Inpatient Hospital Stay (HOSPITAL_COMMUNITY)
Admission: EM | Admit: 2021-06-17 | Discharge: 2021-06-21 | DRG: 378 | Disposition: A | Discharge status: Home / Self Care | Payer: BC Managed Care – PPO | Attending: Internal Medicine | Admitting: Internal Medicine

## 2021-06-17 DIAGNOSIS — K921 Melena: Principal | ICD-10-CM

## 2021-06-17 DIAGNOSIS — D696 Thrombocytopenia, unspecified: Secondary | ICD-10-CM | POA: Diagnosis not present

## 2021-06-17 DIAGNOSIS — D62 Acute posthemorrhagic anemia: Secondary | ICD-10-CM | POA: Diagnosis present

## 2021-06-17 DIAGNOSIS — Z87891 Personal history of nicotine dependence: Secondary | ICD-10-CM | POA: Diagnosis not present

## 2021-06-17 DIAGNOSIS — K319 Disease of stomach and duodenum, unspecified: Secondary | ICD-10-CM | POA: Diagnosis not present

## 2021-06-17 DIAGNOSIS — K269 Duodenal ulcer, unspecified as acute or chronic, without hemorrhage or perforation: Secondary | ICD-10-CM | POA: Diagnosis not present

## 2021-06-17 DIAGNOSIS — Z87440 Personal history of urinary (tract) infections: Secondary | ICD-10-CM | POA: Diagnosis not present

## 2021-06-17 DIAGNOSIS — N200 Calculus of kidney: Secondary | ICD-10-CM | POA: Diagnosis not present

## 2021-06-17 DIAGNOSIS — E8770 Fluid overload, unspecified: Secondary | ICD-10-CM | POA: Diagnosis present

## 2021-06-17 DIAGNOSIS — Z8546 Personal history of malignant neoplasm of prostate: Secondary | ICD-10-CM | POA: Diagnosis not present

## 2021-06-17 DIAGNOSIS — C61 Malignant neoplasm of prostate: Secondary | ICD-10-CM | POA: Diagnosis present

## 2021-06-17 DIAGNOSIS — Z9079 Acquired absence of other genital organ(s): Secondary | ICD-10-CM

## 2021-06-17 DIAGNOSIS — Z79899 Other long term (current) drug therapy: Secondary | ICD-10-CM

## 2021-06-17 DIAGNOSIS — K219 Gastro-esophageal reflux disease without esophagitis: Secondary | ICD-10-CM | POA: Diagnosis not present

## 2021-06-17 DIAGNOSIS — R109 Unspecified abdominal pain: Secondary | ICD-10-CM | POA: Diagnosis not present

## 2021-06-17 DIAGNOSIS — N179 Acute kidney failure, unspecified: Secondary | ICD-10-CM | POA: Diagnosis present

## 2021-06-17 DIAGNOSIS — K222 Esophageal obstruction: Secondary | ICD-10-CM | POA: Diagnosis not present

## 2021-06-17 DIAGNOSIS — R0789 Other chest pain: Secondary | ICD-10-CM | POA: Diagnosis not present

## 2021-06-17 DIAGNOSIS — K3189 Other diseases of stomach and duodenum: Secondary | ICD-10-CM | POA: Diagnosis not present

## 2021-06-17 DIAGNOSIS — Z853 Personal history of malignant neoplasm of breast: Secondary | ICD-10-CM

## 2021-06-17 DIAGNOSIS — K264 Chronic or unspecified duodenal ulcer with hemorrhage: Secondary | ICD-10-CM | POA: Diagnosis not present

## 2021-06-17 DIAGNOSIS — K8020 Calculus of gallbladder without cholecystitis without obstruction: Secondary | ICD-10-CM | POA: Diagnosis not present

## 2021-06-17 DIAGNOSIS — Z87442 Personal history of urinary calculi: Secondary | ICD-10-CM | POA: Insufficient documentation

## 2021-06-17 DIAGNOSIS — K922 Gastrointestinal hemorrhage, unspecified: Secondary | ICD-10-CM | POA: Diagnosis not present

## 2021-06-17 DIAGNOSIS — K5730 Diverticulosis of large intestine without perforation or abscess without bleeding: Secondary | ICD-10-CM | POA: Diagnosis present

## 2021-06-17 DIAGNOSIS — D649 Anemia, unspecified: Secondary | ICD-10-CM | POA: Diagnosis not present

## 2021-06-17 DIAGNOSIS — N390 Urinary tract infection, site not specified: Secondary | ICD-10-CM | POA: Diagnosis not present

## 2021-06-17 DIAGNOSIS — D72829 Elevated white blood cell count, unspecified: Secondary | ICD-10-CM

## 2021-06-17 DIAGNOSIS — K2091 Esophagitis, unspecified with bleeding: Secondary | ICD-10-CM | POA: Diagnosis not present

## 2021-06-17 DIAGNOSIS — K2980 Duodenitis without bleeding: Secondary | ICD-10-CM | POA: Diagnosis present

## 2021-06-17 DIAGNOSIS — R609 Edema, unspecified: Secondary | ICD-10-CM | POA: Diagnosis not present

## 2021-06-17 DIAGNOSIS — R9431 Abnormal electrocardiogram [ECG] [EKG]: Secondary | ICD-10-CM | POA: Diagnosis not present

## 2021-06-17 LAB — COMPREHENSIVE METABOLIC PANEL
ALT: 20 U/L (ref 0–44)
AST: 21 U/L (ref 15–41)
Albumin: 3.5 g/dL (ref 3.5–5.0)
Alkaline Phosphatase: 54 U/L (ref 38–126)
Anion gap: 7 (ref 5–15)
BUN: 33 mg/dL — ABNORMAL HIGH (ref 8–23)
CO2: 22 mmol/L (ref 22–32)
Calcium: 9.1 mg/dL (ref 8.9–10.3)
Chloride: 107 mmol/L (ref 98–111)
Creatinine, Ser: 1.26 mg/dL — ABNORMAL HIGH (ref 0.61–1.24)
GFR, Estimated: >60 mL/min (ref >60)
Glucose, Bld: 133 mg/dL — ABNORMAL HIGH (ref 70–99)
Potassium: 3.6 mmol/L (ref 3.5–5.1)
Sodium: 136 mmol/L (ref 135–145)
Total Bilirubin: 0.6 mg/dL (ref 0.3–1.2)
Total Protein: 5.5 g/dL — ABNORMAL LOW (ref 6.5–8.1)

## 2021-06-17 LAB — URINALYSIS, ROUTINE W REFLEX MICROSCOPIC
Bacteria, UA: NONE SEEN
Bilirubin Urine: NEGATIVE
Glucose, UA: NEGATIVE mg/dL
Hgb urine dipstick: NEGATIVE
Ketones, ur: NEGATIVE mg/dL
Nitrite: NEGATIVE
Protein, ur: NEGATIVE mg/dL
Specific Gravity, Urine: 1.016 (ref 1.005–1.030)
pH: 5 (ref 5.0–8.0)

## 2021-06-17 LAB — PREPARE RBC (CROSSMATCH)

## 2021-06-17 LAB — CBC WITH DIFFERENTIAL/PLATELET
Abs Immature Granulocytes: 0.1 10*3/uL — ABNORMAL HIGH (ref 0.00–0.07)
Basophils Absolute: 0 10*3/uL (ref 0.0–0.1)
Basophils Relative: 0 %
Eosinophils Absolute: 0.2 10*3/uL (ref 0.0–0.5)
Eosinophils Relative: 2 %
HCT: 17.9 % — ABNORMAL LOW (ref 39.0–52.0)
Hemoglobin: 6.5 g/dL — CL (ref 13.0–17.0)
Immature Granulocytes: 1 %
Lymphocytes Relative: 17 %
Lymphs Abs: 1.8 10*3/uL (ref 0.7–4.0)
MCH: 36.5 pg — ABNORMAL HIGH (ref 26.0–34.0)
MCHC: 36.3 g/dL — ABNORMAL HIGH (ref 30.0–36.0)
MCV: 100.6 fL — ABNORMAL HIGH (ref 80.0–100.0)
Monocytes Absolute: 0.7 10*3/uL (ref 0.1–1.0)
Monocytes Relative: 7 %
Neutro Abs: 8 10*3/uL — ABNORMAL HIGH (ref 1.7–7.7)
Neutrophils Relative %: 73 %
Platelets: 113 10*3/uL — ABNORMAL LOW (ref 150–400)
RBC: 1.78 MIL/uL — ABNORMAL LOW (ref 4.22–5.81)
RDW: 16.6 % — ABNORMAL HIGH (ref 11.5–15.5)
WBC: 11 10*3/uL — ABNORMAL HIGH (ref 4.0–10.5)
nRBC: 0.6 % — ABNORMAL HIGH (ref 0.0–0.2)

## 2021-06-17 LAB — RETICULOCYTES
Immature Retic Fract: 46.5 % — ABNORMAL HIGH (ref 2.3–15.9)
RBC.: 1.83 MIL/uL — ABNORMAL LOW (ref 4.22–5.81)
Retic Count, Absolute: 163.1 10*3/uL (ref 19.0–186.0)
Retic Ct Pct: 8.9 % — ABNORMAL HIGH (ref 0.4–3.1)

## 2021-06-17 LAB — FERRITIN: Ferritin: 55 ng/mL (ref 24–336)

## 2021-06-17 LAB — TROPONIN I (HIGH SENSITIVITY)
Troponin I (High Sensitivity): 16 ng/L (ref <18)
Troponin I (High Sensitivity): 15 ng/L (ref <18)

## 2021-06-17 LAB — IRON AND TIBC
Iron: 101 ug/dL (ref 45–182)
Saturation Ratios: 33 % (ref 17.9–39.5)
TIBC: 311 ug/dL (ref 250–450)
UIBC: 210 ug/dL

## 2021-06-17 LAB — LIPASE, BLOOD: Lipase: 57 U/L — ABNORMAL HIGH (ref 11–51)

## 2021-06-17 MED ORDER — PANTOPRAZOLE SODIUM 40 MG IV SOLR
40.0000 mg | Freq: Once | INTRAVENOUS | Status: AC
  Administered 2021-06-17: 40 mg via INTRAVENOUS
  Filled 2021-06-17: qty 10

## 2021-06-17 MED ORDER — SULFAMETHOXAZOLE-TRIMETHOPRIM 800-160 MG PO TABS: 1.0000 | ORAL_TABLET | Freq: Two times a day (BID) | ORAL | Status: DC

## 2021-06-17 MED ORDER — SODIUM CHLORIDE 0.9 % IV SOLN
10.0000 mL/h | Freq: Once | INTRAVENOUS | Status: AC
  Administered 2021-06-17: 10 mL/h via INTRAVENOUS

## 2021-06-17 MED ORDER — TRIMETHOPRIM 100 MG PO TABS
100.0000 mg | ORAL_TABLET | Freq: Every day | ORAL | Status: DC
  Administered 2021-06-17 – 2021-06-18 (×2): 100 mg via ORAL
  Filled 2021-06-17 (×3): qty 1

## 2021-06-17 MED ORDER — SODIUM CHLORIDE (PF) 0.9 % IJ SOLN
INTRAMUSCULAR | Status: AC
  Filled 2021-06-17: qty 50

## 2021-06-17 MED ORDER — ACETAMINOPHEN 325 MG PO TABS
650.0000 mg | ORAL_TABLET | Freq: Four times a day (QID) | ORAL | Status: DC | PRN
  Administered 2021-06-17 – 2021-06-18 (×2): 650 mg via ORAL
  Filled 2021-06-17 (×2): qty 2

## 2021-06-17 MED ORDER — SODIUM CHLORIDE 0.9 % IV SOLN: INTRAVENOUS | Status: DC

## 2021-06-17 MED ORDER — ACETAMINOPHEN 650 MG RE SUPP: 650.0000 mg | Freq: Four times a day (QID) | RECTAL | Status: DC | PRN

## 2021-06-17 MED ORDER — SODIUM CHLORIDE 0.9% FLUSH
3.0000 mL | Freq: Two times a day (BID) | INTRAVENOUS | Status: DC
  Administered 2021-06-18 – 2021-06-21 (×6): 3 mL via INTRAVENOUS

## 2021-06-17 MED ORDER — PANTOPRAZOLE SODIUM 40 MG IV SOLR
40.0000 mg | Freq: Two times a day (BID) | INTRAVENOUS | Status: DC
  Administered 2021-06-18 (×2): 40 mg via INTRAVENOUS
  Filled 2021-06-17 (×3): qty 10

## 2021-06-17 NOTE — H&P (Signed)
?History and Physical  ? ?Kemo C Ramos FUX:[id number] DOB: August 05, 1960 DOA: 06/17/2021 ? ?PCP: Alroy Dust, L.Marlou Sa, MD  ? ?Patient coming from: Home ? ?Chief Complaint: Rectal bleeding ? ?HPI: Adam Ramos is a 61 y.o. male with medical history significant of prostate cancer status post prostatectomy in 2021, GERD, diverticulosis, renal stones presenting with several days of black stools. ? ?As above patient reports several days of black stools with labile blood pressure.  In addition he is reporting some pain in his lower abdomen and some intermittent chest pain that seems to be worse with exertion.  Also experiencing some weakness.  He states he had some concern that he may be having a recurrent UTI he is currently on suppressive Bactrim. ? ?He did go see his PCP 2 days ago with these concerns and noted to have positive FOBT at that time.  Has had a recent colonoscopy last year by [facility].  He also reports taking some meloxicam for couple weeks last month for urologic procedure. ? ?He denies fevers, chills, shortness of breath, constipation, nausea, vomiting. ? ?ED Course: Vital signs in the ED stable.  Lab work-up included CMP with BUN 33, creatinine stable at 1.26, glucose 133, protein 5.5.  CBC with hemoglobin of 6.5 with a baseline of normal, platelets 113, leukocytosis to 11.  Troponin normal with repeat pending.  Lipase mildly elevated at 57.  Urinalysis showing small leukocytes only.  Urine culture pending.  Patient typed and screened in the ED.  CT abdomen pelvis showing bilateral nonobstructing renal calculi, cholelithiasis and diverticulosis.  Patient received IV PPI and 2 units of blood of been ordered.  GI consulted and will see the patient the morning. ? ?Review of Systems: As per HPI otherwise all other systems reviewed and are negative. ? ?Past Medical History:  ?Diagnosis Date  ? GERD (gastroesophageal reflux disease)   ? History of kidney stones   ? Nocturia   ? Prostate cancer [facility])  urologist--- dr Louis Meckel  ? s/p prostatectomy 05-17-2019  ? Urinary incontinence   ? Wears contact lenses   ? ? ?Past Surgical History:  ?Procedure Laterality Date  ? CYSTOSCOPY/URETEROSCOPY/HOLMIUM LASER/STENT PLACEMENT Left 02/19/2018  ? Procedure: LEFT URETEROSCOPY/HOLMIUM LASER/ STONE REMOVAL  STENT PLACEMENT;  Surgeon: Ardis Hughs, MD;  Location: [facility];  Service: Urology;  Laterality: Left;  ? EXTRACORPOREAL SHOCK WAVE LITHOTRIPSY Right 09/02/2016  ? Procedure: RIGHT EXTRACORPOREAL SHOCK WAVE LITHOTRIPSY (ESWL);  Surgeon: Nickie Retort, MD;  Location: [facility];  Service: Urology;  Laterality: Right;  ? EXTRACORPOREAL SHOCK WAVE LITHOTRIPSY  02-17-2014;  05-20-2011  '@[facility]'$   ? FOREIGN BODY REMOVAL N/A 09/02/2019  ? Procedure: CYSTOSCOPY FOREIGN BODY REMOVAL ADULT;  Surgeon: Ardis Hughs, MD;  [facility];  Service: Urology;  Laterality: N/A;  ? HOLMIUM LASER APPLICATION Left 0/03/5425  ? Procedure: HOLMIUM LASER APPLICATION;  Surgeon: Ardis Hughs, MD;  Location: [facility];  Service: Urology;  Laterality: Left;  ? PELVIC LYMPH NODE DISSECTION Bilateral 05/17/2019  ? Procedure: PELVIC LYMPH NODE DISSECTION;  Surgeon: Ardis Hughs, MD;  Location: [facility];  Service: Urology;  Laterality: Bilateral;  ? ROBOT ASSISTED LAPAROSCOPIC RADICAL PROSTATECTOMY N/A 05/17/2019  ? Procedure: XI ROBOTIC ASSISTED LAPAROSCOPIC RADICAL PROSTATECTOMY;  Surgeon: Ardis Hughs, MD;  Location: [facility];  Service: Urology;  Laterality: N/A;  ? SHOULDER ARTHROSCOPY Right early 2000s  ? removal spurs  ? ? ?Social History ? reports that he quit smoking about 38  years ago. His smoking use included cigarettes. He has never used smokeless tobacco. He reports current alcohol use. He reports that he does not use drugs. ? ?No Known Allergies ? ?No family history on file. ? ?Prior to Admission medications   ?Medication Sig Start Date End Date Taking?  Authorizing Provider  ?acetaminophen (TYLENOL) 500 MG tablet Take 500 mg by mouth every 6 (six) hours as needed for moderate pain.   Yes [provider]  ?docusate sodium (COLACE) 100 MG capsule Take 100 mg by mouth daily as needed for mild constipation.   Yes [provider]  ?sulfamethoxazole-trimethoprim (BACTRIM DS) 800-160 MG tablet Take 1 tablet by mouth 2 (two) times daily. 05/01/21  Yes [provider]  ?phenazopyridine (PYRIDIUM) 200 MG tablet Take 1 tablet (200 mg total) by mouth 3 (three) times daily as needed for pain. ?Patient not taking: Reported on 06/17/2021 09/02/19   Ardis Hughs, MD  ?traMADol (ULTRAM) 50 MG tablet Take 1-2 tablets (50-100 mg total) by mouth every 6 (six) hours as needed for moderate pain. ?Patient not taking: Reported on 06/17/2021 09/02/19   Ardis Hughs, MD  ? ? ?Physical Exam: ?Vitals:  ? 06/17/21 1815 06/17/21 1845 06/17/21 1915 06/17/21 1930  ?BP: 118/65 115/73 121/69 124/76  ?Pulse: 72 81 72 85  ?Resp: 17 17    ?Temp:    97.8 ?F (36.6 ?C)  ?TempSrc:      ?SpO2: 99% 98% 97% 99%  ? ? ?Physical Exam ?Constitutional:   ?   General: He is not in acute distress. ?   Appearance: Normal appearance.  ?HENT:  ?   Head: Normocephalic and atraumatic.  ?   Mouth/Throat:  ?   Mouth: Mucous membranes are moist.  ?   Pharynx: Oropharynx is clear.  ?Eyes:  ?   Extraocular Movements: Extraocular movements intact.  ?   Pupils: Pupils are equal, round, and reactive to light.  ?Cardiovascular:  ?   Rate and Rhythm: Normal rate and regular rhythm.  ?   Pulses: Normal pulses.  ?   Heart sounds: Normal heart sounds.  ?Pulmonary:  ?   Effort: Pulmonary effort is normal. No respiratory distress.  ?   Breath sounds: Normal breath sounds.  ?Abdominal:  ?   General: Bowel sounds are normal. There is no distension.  ?   Palpations: Abdomen is soft.  ?   Tenderness: There is abdominal tenderness.  ?Musculoskeletal:     ?   General: No swelling or deformity.  ?Skin: ?    General: Skin is warm and dry.  ?Neurological:  ?   General: No focal deficit present.  ?   Mental Status: Mental status is at baseline.  ? ?Labs on Admission: I have personally reviewed following labs and imaging studies ? ?CBC: ?Recent Labs  ?Lab 06/17/21 ?1532  ?WBC 11.0*  ?NEUTROABS 8.0*  ?HGB 6.5*  ?HCT 17.9*  ?MCV 100.6*  ?PLT 113*  ? ? ?Basic Metabolic Panel: ?Recent Labs  ?Lab 06/17/21 ?1532  ?NA 136  ?K 3.6  ?CL 107  ?CO2 22  ?GLUCOSE 133*  ?BUN 33*  ?CREATININE 1.26*  ?CALCIUM 9.1  ? ? ?GFR: ?CrCl cannot be calculated (Unknown ideal weight.). ? ?Liver Function Tests: ?Recent Labs  ?Lab 06/17/21 ?1532  ?AST 21  ?ALT 20  ?ALKPHOS 54  ?BILITOT 0.6  ?PROT 5.5*  ?ALBUMIN 3.5  ? ? ?Urine analysis: ?   ?Component Value Date/Time  ? COLORURINE YELLOW 06/17/2021 1546  ? [place]  06/17/2021 1546  ? LABSPEC 1.016 06/17/2021 1546  ? PHURINE 5.0 06/17/2021 1546  ? GLUCOSEU NEGATIVE 06/17/2021 1546  ? [place] NEGATIVE 06/17/2021 1546  ? [place] NEGATIVE 06/17/2021 1546  ? BILIRUBINUR small 04/19/2011 0929  ? [place] NEGATIVE 06/17/2021 1546  ? PROTEINUR NEGATIVE 06/17/2021 1546  ? UROBILINOGEN 0.2 04/19/2011 0929  ? NITRITE NEGATIVE 06/17/2021 1546  ? LEUKOCYTESUR SMALL (A) 06/17/2021 1546  ? ? ?Radiological Exams on Admission: ?CT Abdomen Pelvis Wo Contrast ? ?Result Date: 06/17/2021 ?CLINICAL DATA:  Abdominal pain. EXAM: CT ABDOMEN AND PELVIS WITHOUT CONTRAST TECHNIQUE: Multidetector CT imaging of the abdomen and pelvis was performed following the standard protocol without IV contrast. RADIATION DOSE REDUCTION: This exam was performed according to the departmental dose-optimization program which includes automated exposure control, adjustment of the mA and/or kV according to patient size and/or use of iterative reconstruction technique. COMPARISON:  March 28, 2020 and April 19, 2011 FINDINGS: Lower chest: No acute abnormality. Hepatobiliary: No focal liver abnormality is seen. A small layer of tiny  gallstones is seen within the dependent portion of an otherwise normal-appearing gallbladder. There is no evidence of biliary dilatation. Pancreas: Unremarkable. No pancreatic ductal dilatation or surrounding inflammatory chang

## 2021-06-17 NOTE — ED Triage Notes (Signed)
Pt states he is having black stools for the past 4 days. States his blood pressure has been up and down. Pt reports lower abdominal pain. Pt reports his kidneys are sore. ?

## 2021-06-17 NOTE — ED Provider Notes (Signed)
?[facility] DEPT ?Provider Note ? ? ?CSN: [id number] ?Arrival date & time: 06/17/21  1448 ? ?  ? ?History ? ?Chief Complaint  ?Patient presents with  ? Rectal Bleeding  ? ? ?Adam Ramos is a 61 y.o. male. ? ?Patient is a 61 year old male who presents with black tarry stools for the last 2 to 3 days.  He also has some pain across his lower abdomen and some dizziness.  He feels increased weakness and has had some intermittent chest pain.  Its in the center of his chest.  Seems to get worse when he walks around.  He does not complain of any chest pain currently.  He also has some "kidney pain".  He had a urodynamics procedure done about a month ago.  He said at that time the dislodged kidney stone and it got lodged in his urethra.  He had an episode of a UTI and was treated with Bactrim.  He is maintained on trimethoprim.  He is concerned he may have another UTI.  He denies any fevers.  He saw his PCP at [place] 2 days ago for the black stools.  They did some blood work and did a rectal exam which she said tested positive for blood.  He does not have any prior history of GI bleeds.  He has previously had a colonoscopy about a year ago through [facility]. ? ? ?  ? ?Home Medications ?Prior to Admission medications   ?Medication Sig Start Date End Date Taking? Authorizing Provider  ?acetaminophen (TYLENOL) 500 MG tablet Take 500 mg by mouth every 6 (six) hours as needed for moderate pain.   Yes [provider]  ?docusate sodium (COLACE) 100 MG capsule Take 100 mg by mouth daily as needed for mild constipation.   Yes [provider]  ?sulfamethoxazole-trimethoprim (BACTRIM DS) 800-160 MG tablet Take 1 tablet by mouth 2 (two) times daily. 05/01/21  Yes [provider]  ?phenazopyridine (PYRIDIUM) 200 MG tablet Take 1 tablet (200 mg total) by mouth 3 (three) times daily as needed for pain. ?Patient not taking: Reported on 06/17/2021 09/02/19   Ardis Hughs,  MD  ?traMADol (ULTRAM) 50 MG tablet Take 1-2 tablets (50-100 mg total) by mouth every 6 (six) hours as needed for moderate pain. ?Patient not taking: Reported on 06/17/2021 09/02/19   Ardis Hughs, MD  ?   ? ?Allergies    ?Patient has no known allergies.   ? ?Review of Systems   ?Review of Systems  ?Constitutional:  Positive for fatigue. Negative for chills, diaphoresis and fever.  ?HENT:  Negative for congestion, rhinorrhea and sneezing.   ?Eyes: Negative.   ?Respiratory:  Positive for chest tightness. Negative for cough and shortness of breath.   ?Cardiovascular:  Positive for chest pain. Negative for leg swelling.  ?Gastrointestinal:  Positive for abdominal pain and blood in stool. Negative for diarrhea, nausea and vomiting.  ?Genitourinary:  Negative for difficulty urinating, flank pain, frequency and hematuria.  ?Musculoskeletal:  Negative for arthralgias and back pain.  ?Skin:  Negative for rash.  ?Neurological:  Positive for light-headedness. Negative for dizziness, speech difficulty, weakness, numbness and headaches.  ? ?Physical Exam ?Updated Vital Signs ?BP 115/73   Pulse 81   Temp 98.1 ?F (36.7 ?C) (Oral)   Resp 17   SpO2 98%  ?Physical Exam ?Constitutional:   ?   Appearance: He is well-developed.  ?HENT:  ?   Head: Normocephalic and atraumatic.  ?Eyes:  ?  Pupils: Pupils are equal, round, and reactive to light.  ?Cardiovascular:  ?   Rate and Rhythm: Normal rate and regular rhythm.  ?   Heart sounds: Normal heart sounds.  ?Pulmonary:  ?   Effort: Pulmonary effort is normal. No respiratory distress.  ?   Breath sounds: Normal breath sounds. No wheezing or rales.  ?Chest:  ?   Chest wall: No tenderness.  ?Abdominal:  ?   General: Bowel sounds are normal.  ?   Palpations: Abdomen is soft.  ?   Tenderness: There is abdominal tenderness (Mild tenderness to lower abdomen bilaterally). There is no guarding or rebound.  ?Musculoskeletal:     ?   General: Normal range of motion.  ?   Cervical back:  Normal range of motion and neck supple.  ?Lymphadenopathy:  ?   Cervical: No cervical adenopathy.  ?Skin: ?   General: Skin is warm and dry.  ?   Findings: No rash.  ?Neurological:  ?   Mental Status: He is alert and oriented to person, place, and time.  ? ? ?ED Results / Procedures / Treatments   ?Labs ?(all labs ordered are listed, but only abnormal results are displayed) ?Labs Reviewed  ?COMPREHENSIVE METABOLIC PANEL - Abnormal; Notable for the following components:  ?    Result Value  ? Glucose, Bld 133 (*)   ? BUN 33 (*)   ? Creatinine, Ser 1.26 (*)   ? Total Protein 5.5 (*)   ? All other components within normal limits  ?CBC WITH DIFFERENTIAL/PLATELET - Abnormal; Notable for the following components:  ? WBC 11.0 (*)   ? RBC 1.78 (*)   ? Hemoglobin 6.5 (*)   ? HCT 17.9 (*)   ? MCV 100.6 (*)   ? MCH 36.5 (*)   ? MCHC 36.3 (*)   ? RDW 16.6 (*)   ? Platelets 113 (*)   ? nRBC 0.6 (*)   ? Neutro Abs 8.0 (*)   ? Abs Immature Granulocytes 0.10 (*)   ? All other components within normal limits  ?LIPASE, BLOOD - Abnormal; Notable for the following components:  ? Lipase 57 (*)   ? All other components within normal limits  ?URINALYSIS, ROUTINE W REFLEX MICROSCOPIC - Abnormal; Notable for the following components:  ? Leukocytes,Ua SMALL (*)   ? All other components within normal limits  ?URINE CULTURE  ?HIV ANTIBODY (ROUTINE TESTING W REFLEX)  ?BASIC METABOLIC PANEL  ?CBC  ?TYPE AND SCREEN  ?PREPARE RBC (CROSSMATCH)  ?TROPONIN I (HIGH SENSITIVITY)  ?TROPONIN I (HIGH SENSITIVITY)  ? ? ?EKG ?EKG Interpretation ? ?Date/Time:  Sunday Jun 17 2021 15:18:32 EDT ?Ventricular Rate:  82 ?PR Interval:  157 ?QRS Duration: 95 ?QT Interval:  394 ?QTC Calculation: 461 ?R Axis:   -1 ?Text Interpretation: Sinus rhythm Borderline ST depression, lateral leads Confirmed by Malvin Johns [phone]) on 06/17/2021 4:38:28 PM ? ?Radiology ?CT Abdomen Pelvis Wo Contrast ? ?Result Date: 06/17/2021 ?CLINICAL DATA:  Abdominal pain. EXAM: CT ABDOMEN AND  PELVIS WITHOUT CONTRAST TECHNIQUE: Multidetector CT imaging of the abdomen and pelvis was performed following the standard protocol without IV contrast. RADIATION DOSE REDUCTION: This exam was performed according to the departmental dose-optimization program which includes automated exposure control, adjustment of the mA and/or kV according to patient size and/or use of iterative reconstruction technique. COMPARISON:  March 28, 2020 and April 19, 2011 FINDINGS: Lower chest: No acute abnormality. Hepatobiliary: No focal liver abnormality is seen. A small layer of tiny gallstones  is seen within the dependent portion of an otherwise normal-appearing gallbladder. There is no evidence of biliary dilatation. Pancreas: Unremarkable. No pancreatic ductal dilatation or surrounding inflammatory changes. Spleen: Normal in size without focal abnormality. Adrenals/Urinary Tract: Adrenal glands are unremarkable. Kidneys are normal in size, without obstructing renal calculi, focal lesion, or hydronephrosis. Mild prominence of an extrarenal pelvis is seen on the left. A 2 mm nonobstructing renal calculus is seen within the mid right kidney. 2 mm, 3 mm, 4 mm and 5 mm nonobstructing renal calculi are seen within the mid left kidney. Bladder is unremarkable. Stomach/Bowel: Stomach is within normal limits. Appendix appears normal. No evidence of bowel wall thickening, distention, or inflammatory changes. Very small, noninflamed diverticula are seen within the sigmoid colon. Vascular/Lymphatic: No significant vascular findings are present. Very mild, stable nonspecific mesenteric inflammatory fat stranding is seen along the medial aspect of the mid to upper left abdomen. Several subcentimeter mesenteric lymph nodes are also noted within this region. This finding is chronic and seen as far back as April 19, 2011. Reproductive: The prostate gland is surgically absent. A small right-sided hydrocele is noted. Other: No abdominal wall  hernia or abnormality. No abdominopelvic ascites. Musculoskeletal: Degenerative changes seen within the lumbar spine. This is most prominent at the levels of L1-L2 and L4-L5. IMPRESSION: 1. Bilateral subcentimeter non

## 2021-06-17 NOTE — ED Provider Triage Note (Signed)
Emergency Medicine Provider Triage Evaluation Note ? ?Adam Ramos , a 61 y.o. male  was evaluated in triage.  Pt complains of dark stools for the past 4 days.  Reports associated right flank and abdominal pain.  No dysuria or urinary frequency. ? ?Physical Exam  ?BP 134/77 (BP Location: Right Arm)   Pulse 82   Temp 99.2 ?F (37.3 ?C) (Oral)   Resp 18   SpO2 98%  ?Gen:   Awake, no distress   ?Resp:  Normal effort  ?MSK:   Moves extremities without difficulty  ?Other:   ? ?Medical Decision Making  ?Medically screening exam initiated at 3:24 PM.  Appropriate orders placed.  Adam Ramos was informed that the remainder of the evaluation will be completed by another provider, this initial triage assessment does not replace that evaluation, and the importance of remaining in the ED until their evaluation is complete. ?  ?Rayna Sexton, PA-C ?06/17/21 1525 ? ?

## 2021-06-18 ENCOUNTER — Encounter (HOSPITAL_COMMUNITY): Payer: Self-pay | Admitting: Internal Medicine

## 2021-06-18 ENCOUNTER — Observation Stay (HOSPITAL_COMMUNITY): Payer: BC Managed Care – PPO | Admitting: Registered Nurse

## 2021-06-18 ENCOUNTER — Encounter (HOSPITAL_COMMUNITY)
Admission: EM | Disposition: A | Discharge status: Home / Self Care | Payer: Self-pay | Source: Home / Self Care | Attending: Internal Medicine

## 2021-06-18 DIAGNOSIS — D72829 Elevated white blood cell count, unspecified: Secondary | ICD-10-CM | POA: Diagnosis not present

## 2021-06-18 DIAGNOSIS — C61 Malignant neoplasm of prostate: Secondary | ICD-10-CM | POA: Diagnosis not present

## 2021-06-18 DIAGNOSIS — N390 Urinary tract infection, site not specified: Secondary | ICD-10-CM | POA: Diagnosis not present

## 2021-06-18 DIAGNOSIS — K921 Melena: Secondary | ICD-10-CM | POA: Diagnosis not present

## 2021-06-18 HISTORY — PX: ESOPHAGOGASTRODUODENOSCOPY (EGD) WITH PROPOFOL: SHX5813

## 2021-06-18 HISTORY — PX: BIOPSY: SHX5522

## 2021-06-18 LAB — BASIC METABOLIC PANEL
Anion gap: 5 (ref 5–15)
BUN: 22 mg/dL (ref 8–23)
CO2: 22 mmol/L (ref 22–32)
Calcium: 8.3 mg/dL — ABNORMAL LOW (ref 8.9–10.3)
Chloride: 113 mmol/L — ABNORMAL HIGH (ref 98–111)
Creatinine, Ser: 1.26 mg/dL — ABNORMAL HIGH (ref 0.61–1.24)
GFR, Estimated: >60 mL/min (ref >60)
Glucose, Bld: 103 mg/dL — ABNORMAL HIGH (ref 70–99)
Potassium: 3.6 mmol/L (ref 3.5–5.1)
Sodium: 140 mmol/L (ref 135–145)

## 2021-06-18 LAB — CBC
HCT: 20.8 % — ABNORMAL LOW (ref 39.0–52.0)
Hemoglobin: 7.5 g/dL — ABNORMAL LOW (ref 13.0–17.0)
MCH: 34.6 pg — ABNORMAL HIGH (ref 26.0–34.0)
MCHC: 36.1 g/dL — ABNORMAL HIGH (ref 30.0–36.0)
MCV: 95.9 fL (ref 80.0–100.0)
Platelets: 109 10*3/uL — ABNORMAL LOW (ref 150–400)
RBC: 2.17 MIL/uL — ABNORMAL LOW (ref 4.22–5.81)
RDW: 20 % — ABNORMAL HIGH (ref 11.5–15.5)
WBC: 7.2 10*3/uL (ref 4.0–10.5)
nRBC: 1 % — ABNORMAL HIGH (ref 0.0–0.2)

## 2021-06-18 LAB — HIV ANTIBODY (ROUTINE TESTING W REFLEX): HIV Screen 4th Generation wRfx: NONREACTIVE

## 2021-06-18 SURGERY — ESOPHAGOGASTRODUODENOSCOPY (EGD) WITH PROPOFOL
Anesthesia: Monitor Anesthesia Care

## 2021-06-18 MED ORDER — EPHEDRINE SULFATE-NACL 50-0.9 MG/10ML-% IV SOSY
PREFILLED_SYRINGE | INTRAVENOUS | Status: DC | PRN
  Administered 2021-06-18: 5 mg via INTRAVENOUS

## 2021-06-18 MED ORDER — PROPOFOL 500 MG/50ML IV EMUL
INTRAVENOUS | Status: DC | PRN
  Administered 2021-06-18: 130 ug/kg/min via INTRAVENOUS

## 2021-06-18 MED ORDER — PROPOFOL 10 MG/ML IV BOLUS
INTRAVENOUS | Status: DC | PRN
  Administered 2021-06-18: 20 mg via INTRAVENOUS
  Administered 2021-06-18: 10 mg via INTRAVENOUS

## 2021-06-18 MED ORDER — LACTATED RINGERS IV SOLN
INTRAVENOUS | Status: AC | PRN
Start: 2021-06-18 — End: 2021-06-18
  Administered 2021-06-18: 1000 mL via INTRAVENOUS

## 2021-06-18 MED ORDER — PROPOFOL 1000 MG/100ML IV EMUL
INTRAVENOUS | Status: AC
  Filled 2021-06-18: qty 100

## 2021-06-18 MED ORDER — LIP MEDEX EX OINT
TOPICAL_OINTMENT | CUTANEOUS | Status: DC | PRN
  Filled 2021-06-18: qty 7

## 2021-06-18 SURGICAL SUPPLY — 15 items

## 2021-06-18 NOTE — Progress Notes (Signed)
?PROGRESS NOTE ? ? ? ?Adam Ramos  PFX:[id number] DOB: August 19, 1960 DOA: 06/17/2021 ?PCP: Alroy Dust, L.Marlou Sa, MD  ? ?Brief Narrative:  ?61 y.o. male with medical history significant of prostate cancer status post prostatectomy in 2021, GERD, diverticulosis, renal stones presented with black stools.  On presentation, hemoglobin was 6.5.  CT of abdomen and pelvis showed bilateral nonobstructing renal calculi, cholelithiasis and diverticulosis.  He was started on IV Protonix and 2 units of packed red cells were transfused.  GI was consulted. ? ?Assessment & Plan: ?  ?Possible upper GI bleeding presenting with melena ?Acute blood loss anemia ?-Presented with melena and hemoglobin of 6.5.  Status post 2 units packed red cells transfusion.  Monitor H&H.  Hemoglobin 7.5 today.  Continue IV Protonix.  GI following and planning for EGD today ? ?Thrombocytopenia ?-Questionable cause.  Monitor ? ?Leukocytosis ?-Mild.  Resolved ? ?Recurrent UTI ?-Continue home Bactrim ? ?GERD ?-Continue PPI ? ?History of breast cancer status postresection in 2021 ?-Outpatient follow-up ? ? ? ?DVT prophylaxis: SCDs ?Code Status: Full ?Family Communication: Sister at bedside ?Disposition Plan: ?Status is: Observation ?The patient will require care spanning > 2 midnights and should be moved to inpatient because: Of H&H monitoring and need for GI intervention today. ? ? ? ?Consultants: GI ? ?Procedures: None ? ?Antimicrobials:  ?Patient seen and examined at bedside.  Denies any black stools since admission.  No overnight fever, nausea, vomiting, chest pain reported. ? ?Subjective: ? ? ?Objective: ?Vitals:  ? 06/18/21 3299 06/18/21 2426 06/18/21 0529 06/18/21 0752  ?BP: 110/75 110/75 110/64 102/67  ?Pulse: 68 68 67 69  ?Resp: '17 17 15 16  '$ ?Temp: 98.6 ?F (37 ?C) 98.6 ?F (37 ?C) 98.7 ?F (37.1 ?C) 99 ?F (37.2 ?C)  ?TempSrc: Oral Oral Oral Oral  ?SpO2: 97% 97% 96% 96%  ?Weight:      ?Height:      ? ? ?Intake/Output Summary (Last 24 hours) at 06/18/2021  1143 ?Last data filed at 06/18/2021 0750 ?Gross per 24 hour  ?Intake 938 ml  ?Output --  ?Net 938 ml  ? ?Filed Weights  ? 06/17/21 2017  ?Weight: 104.3 kg  ? ? ?Examination: ? ?General exam: Appears calm and comfortable.  Currently on room air. ?Respiratory system: Bilateral decreased breath sounds at bases ?Cardiovascular system: S1 & S2 heard, Rate controlled ?Gastrointestinal system: Abdomen is nondistended, soft and nontender. Normal bowel sounds heard. ?Extremities: No cyanosis, clubbing, edema  ?Central nervous system: Alert and oriented. No focal neurological deficits. Moving extremities ?Skin: No rashes, lesions or ulcers ?Psychiatry: Affect is mostly flat.  No signs of agitation. ? ? ?Data Reviewed: I have personally reviewed following labs and imaging studies ? ?CBC: ?Recent Labs  ?Lab 06/17/21 ?1532 06/18/21 ?1004  ?WBC 11.0* 7.2  ?NEUTROABS 8.0*  --   ?HGB 6.5* 7.5*  ?HCT 17.9* 20.8*  ?MCV 100.6* 95.9  ?PLT 113* 109*  ? ?Basic Metabolic Panel: ?Recent Labs  ?Lab 06/17/21 ?1532 06/18/21 ?1004  ?NA 136 140  ?K 3.6 3.6  ?CL 107 113*  ?CO2 22 22  ?GLUCOSE 133* 103*  ?BUN 33* 22  ?CREATININE 1.26* 1.26*  ?CALCIUM 9.1 8.3*  ? ?GFR: ?Estimated Creatinine Clearance: 74.9 mL/min (A) (by C-G formula based on SCr of 1.26 mg/dL (H)). ?Liver Function Tests: ?Recent Labs  ?Lab 06/17/21 ?1532  ?AST 21  ?ALT 20  ?ALKPHOS 54  ?BILITOT 0.6  ?PROT 5.5*  ?ALBUMIN 3.5  ? ?Recent Labs  ?Lab 06/17/21 ?1532  ?LIPASE 57*  ? ?No  results for input(s): AMMONIA in the last 168 hours. ?Coagulation Profile: ?No results for input(s): INR, PROTIME in the last 168 hours. ?Cardiac Enzymes: ?No results for input(s): CKTOTAL, CKMB, CKMBINDEX, TROPONINI in the last 168 hours. ?BNP (last 3 results) ?No results for input(s): PROBNP in the last 8760 hours. ?HbA1C: ?No results for input(s): HGBA1C in the last 72 hours. ?CBG: ?No results for input(s): GLUCAP in the last 168 hours. ?Lipid Profile: ?No results for input(s): CHOL, HDL, LDLCALC, TRIG,  CHOLHDL, LDLDIRECT in the last 72 hours. ?Thyroid Function Tests: ?No results for input(s): TSH, T4TOTAL, FREET4, T3FREE, THYROIDAB in the last 72 hours. ?Anemia Panel: ?Recent Labs  ?  06/17/21 ?1532 06/17/21 ?2029  ?FERRITIN  --  55  ?TIBC  --  311  ?IRON  --  101  ?RETICCTPCT 8.9*  --   ? ?Sepsis Labs: ?No results for input(s): PROCALCITON, LATICACIDVEN in the last 168 hours. ? ?No results found for this or any previous visit (from the past 240 hour(s)).  ? ? ? ? ? ?Radiology Studies: ?CT Abdomen Pelvis Wo Contrast ? ?Result Date: 06/17/2021 ?CLINICAL DATA:  Abdominal pain. EXAM: CT ABDOMEN AND PELVIS WITHOUT CONTRAST TECHNIQUE: Multidetector CT imaging of the abdomen and pelvis was performed following the standard protocol without IV contrast. RADIATION DOSE REDUCTION: This exam was performed according to the departmental dose-optimization program which includes automated exposure control, adjustment of the mA and/or kV according to patient size and/or use of iterative reconstruction technique. COMPARISON:  March 28, 2020 and April 19, 2011 FINDINGS: Lower chest: No acute abnormality. Hepatobiliary: No focal liver abnormality is seen. A small layer of tiny gallstones is seen within the dependent portion of an otherwise normal-appearing gallbladder. There is no evidence of biliary dilatation. Pancreas: Unremarkable. No pancreatic ductal dilatation or surrounding inflammatory changes. Spleen: Normal in size without focal abnormality. Adrenals/Urinary Tract: Adrenal glands are unremarkable. Kidneys are normal in size, without obstructing renal calculi, focal lesion, or hydronephrosis. Mild prominence of an extrarenal pelvis is seen on the left. A 2 mm nonobstructing renal calculus is seen within the mid right kidney. 2 mm, 3 mm, 4 mm and 5 mm nonobstructing renal calculi are seen within the mid left kidney. Bladder is unremarkable. Stomach/Bowel: Stomach is within normal limits. Appendix appears normal. No evidence  of bowel wall thickening, distention, or inflammatory changes. Very small, noninflamed diverticula are seen within the sigmoid colon. Vascular/Lymphatic: No significant vascular findings are present. Very mild, stable nonspecific mesenteric inflammatory fat stranding is seen along the medial aspect of the mid to upper left abdomen. Several subcentimeter mesenteric lymph nodes are also noted within this region. This finding is chronic and seen as far back as April 19, 2011. Reproductive: The prostate gland is surgically absent. A small right-sided hydrocele is noted. Other: No abdominal wall hernia or abnormality. No abdominopelvic ascites. Musculoskeletal: Degenerative changes seen within the lumbar spine. This is most prominent at the levels of L1-L2 and L4-L5. IMPRESSION: 1. Bilateral subcentimeter nonobstructing renal calculi. 2. Cholelithiasis. 3. Sigmoid diverticulosis. Electronically Signed   By: Virgina Norfolk M.D.   On: 06/17/2021 17:48   ? ? ? ? ? ?Scheduled Meds: ? pantoprazole (PROTONIX) IV  40 mg Intravenous Q12H  ? sodium chloride flush  3 mL Intravenous Q12H  ? trimethoprim  100 mg Oral Daily  ? ?Continuous Infusions: ? sodium chloride 100 mL/hr at 06/18/21 0522  ? ? ? ? ? ? ? ? ?Aline August, MD ?[facility] ?06/18/2021, 11:43 AM ' ?

## 2021-06-18 NOTE — Anesthesia Postprocedure Evaluation (Signed)
Anesthesia Post Note ? ?Patient: Adam Ramos ? ?Procedure(s) Performed: ESOPHAGOGASTRODUODENOSCOPY (EGD) WITH PROPOFOL ?BIOPSY ? ?  ? ?Patient location during evaluation: Endoscopy ?Anesthesia Type: MAC ?Level of consciousness: awake ?Pain management: pain level controlled ?Vital Signs Assessment: post-procedure vital signs reviewed and stable ?Respiratory status: spontaneous breathing ?Cardiovascular status: stable ?Postop Assessment: no apparent nausea or vomiting ?Anesthetic complications: no ? ? ?No notable events documented. ? ?Last Vitals:  ?Vitals:  ? 06/18/21 1350 06/18/21 1402  ?BP: 100/63 110/70  ?Pulse: 68 65  ?Resp: 14 16  ?Temp:  36.8 ?C  ?SpO2: 94% 96%  ?  ?Last Pain:  ?Vitals:  ? 06/18/21 1402  ?TempSrc: Oral  ?PainSc:   ? ? ?  ?  ?  ?  ?  ?  ? ?Kristyanna Barcelo ? ? ? ? ?

## 2021-06-18 NOTE — Op Note (Signed)
[facility] ?Patient Name: Adam Ramos ?Procedure Date: 06/18/2021 ?MRN: [id number] ?Attending MD: Ronnette Juniper , MD ?Date of Birth: 1960-08-11 ?CSN: [id number] ?Age: 61 ?Admit Type: Outpatient ?Procedure:                Upper GI endoscopy ?Indications:              Acute post hemorrhagic anemia, Melena ?Providers:                Ronnette Juniper, MD, Allayne Gitelman, RN, Despina Pole,  ?                          Technician, Courtney Heys. [place], CRNA ?Referring MD:             [facility] ?Medicines:                Monitored Anesthesia Care ?Complications:            No immediate complications. Estimated blood loss:  ?                          Minimal. ?Estimated Blood Loss:     Estimated blood loss was minimal. ?Procedure:                Pre-Anesthesia Assessment: ?                          - Prior to the procedure, a History and Physical  ?                          was performed, and patient medications and  ?                          allergies were reviewed. The patient's tolerance of  ?                          previous anesthesia was also reviewed. The risks  ?                          and benefits of the procedure and the sedation  ?                          options and risks were discussed with the patient.  ?                          All questions were answered, and informed consent  ?                          was obtained. Prior Anticoagulants: The patient has  ?                          taken no previous anticoagulant or antiplatelet  ?                          agents. ASA Grade Assessment: II - A patient with  ?  mild systemic disease. After reviewing the risks  ?                          and benefits, the patient was deemed in  ?                          satisfactory condition to undergo the procedure. ?                          After obtaining informed consent, the endoscope was  ?                          passed under direct vision. Throughout the  ?                           procedure, the patient's blood pressure, pulse, and  ?                          oxygen saturations were monitored continuously. The  ?                          [id number]-H190 ([id number]) Olympus endoscope was introduced  ?                          through the mouth, and advanced to the third part  ?                          of duodenum. The upper GI endoscopy was  ?                          accomplished without difficulty. The patient  ?                          tolerated the procedure well. ?Scope In: ?Scope Out: ?Findings: ?     The upper third of the esophagus and middle third of the esophagus were  ?     normal. ?     One benign-appearing, intrinsic mild (non-circumferential scarring)  ?     stenosis was found. The stenosis was traversed with an adult scope with  ?     minimal resistance. Minimal amount of ozzing was noted at the GE  ?     junction, with mild esophagitis. ?     The entire examined stomach was normal. ?     The cardia and gastric fundus were normal on retroflexion. ?     Biopsies were taken with a cold forceps in the gastric antrum for  ?     Helicobacter pylori testing. ?     Localized severe inflammation characterized by congestion (edema),  ?     erythema, friability, aphthous ulcerations and shallow ulcerations was  ?     found in the duodenal bulb and in the first portion of the duodenum. ?     Few non-bleeding cratered and superficial duodenal ulcers with a clean  ?     ulcer base (Forrest Class III) were found in the duodenal bulb and in  ?     the first portion of  the duodenum. The largest lesion was 7 mm in  ?     largest dimension. ?     The second portion of the duodenum and third portion of the duodenum  ?     were normal. ?Impression:               - Normal upper third of esophagus and middle third  ?                          of esophagus. ?                          - Benign-appearing esophageal stenosis.Traversed  ?                          with minimal resistance at the GE  junction. ?                          - Normal stomach. ?                          - Acute duodenitis. ?                          - Non-bleeding duodenal ulcers with a clean ulcer  ?                          base (Forrest Class III). ?                          - Normal second portion of the duodenum and third  ?                          portion of the duodenum. ?                          - Biopsies were taken with a cold forceps for  ?                          Helicobacter pylori testing. ?Moderate Sedation: ?     Patient did not receive moderate sedation for this procedure, but  ?     instead received monitored anesthesia care. ?Recommendation:           - Resume regular diet. ?                          - Continue present medications. ?                          - Await pathology results. ?                          - Use Protonix (pantoprazole) 40 mg PO BID for 2  ?                          months. ?                          -  No aspirin, ibuprofen, naproxen, or other  ?                          non-steroidal anti-inflammatory drugs. ?Procedure Code(s):        --- Professional --- ?                          [phone], Esophagogastroduodenoscopy, flexible,  ?                          transoral; with biopsy, single or multiple ?Diagnosis Code(s):        --- Professional --- ?                          K22.2, Esophageal obstruction ?                          K29.80, Duodenitis without bleeding ?                          K26.9, Duodenal ulcer, unspecified as acute or  ?                          chronic, without hemorrhage or perforation ?                          D62, Acute posthemorrhagic anemia ?                          K92.1, Melena (includes Hematochezia) ?CPT copyright 2019 [employer]. All rights reserved. ?The codes documented in this report are preliminary and upon coder review may  ?be revised to meet current compliance requirements. ?Ronnette Juniper, MD ?06/18/2021 1:26:39 PM ?This report has been signed  electronically. ?Number of Addenda: 0 ?

## 2021-06-18 NOTE — Anesthesia Preprocedure Evaluation (Signed)
Anesthesia Evaluation  ?Patient identified by MRN, date of birth, ID band ?Patient awake ? ? ? ?Reviewed: ?Allergy & Precautions, NPO status , Patient's Chart, lab work & pertinent test results ? ?Airway ?Mallampati: II ? ?TM Distance: >3 FB ? ? ? ? Dental ?  ?Pulmonary ?neg pulmonary ROS, former smoker,  ?  ?breath sounds clear to auscultation ? ? ? ? ? ? Cardiovascular ? ?Rhythm:Regular Rate:Normal ? ? ?  ?Neuro/Psych ?  ? GI/Hepatic ?Neg liver ROS, GERD  ,  ?Endo/Other  ?negative endocrine ROS ? Renal/GU ?negative Renal ROS  ? ?  ?Musculoskeletal ? ? Abdominal ?  ?Peds ? Hematology ?  ?Anesthesia Other Findings ? ? Reproductive/Obstetrics ? ?  ? ? ? ? ? ? ? ? ? ? ? ? ? ?  ?  ? ? ? ? ? ? ? ? ?Anesthesia Physical ?Anesthesia Plan ? ?ASA: 3 ? ?Anesthesia Plan: MAC  ? ?Post-op Pain Management:   ? ?Induction: Intravenous ? ?PONV Risk Score and Plan: 1 and Ondansetron and Treatment may vary due to age or medical condition ? ?Airway Management Planned: Simple Face Mask ? ?Additional Equipment:  ? ?Intra-op Plan:  ? ?Post-operative Plan:  ? ?Informed Consent: I have reviewed the patients History and Physical, chart, labs and discussed the procedure including the risks, benefits and alternatives for the proposed anesthesia with the patient or authorized representative who has indicated his/her understanding and acceptance.  ? ? ? ?Dental advisory given ? ?Plan Discussed with: CRNA and Anesthesiologist ? ?Anesthesia Plan Comments:   ? ? ? ? ? ? ?Anesthesia Quick Evaluation ? ?

## 2021-06-18 NOTE — Transfer of Care (Signed)
Immediate Anesthesia Transfer of Care Note ? ?Patient: Adam Ramos ? ?Procedure(s) Performed: ESOPHAGOGASTRODUODENOSCOPY (EGD) WITH PROPOFOL ?BIOPSY ? ?Patient Location: PACU and Endoscopy Unit ? ?Anesthesia Type:MAC ? ?Level of Consciousness: awake, alert , oriented and patient cooperative ? ?Airway & Oxygen Therapy: Patient Spontanous Breathing and Patient connected to face mask oxygen ? ?Post-op Assessment: Report given to RN, Post -op Vital signs reviewed and stable and Patient moving all extremities ? ?Post vital signs: Reviewed and stable ? ?Last Vitals:  ?Vitals Value Taken Time  ?BP 97/59 06/18/21 1327  ?Temp    ?Pulse 66 06/18/21 1329  ?Resp 26 06/18/21 1329  ?SpO2 100 % 06/18/21 1329  ?Vitals shown include unvalidated device data. ? ?Last Pain:  ?Vitals:  ? 06/18/21 1220  ?TempSrc: Tympanic  ?PainSc: 3   ?   ? ?Patients Stated Pain Goal: 0 (06/17/21 2232) ? ?Complications: No notable events documented. ?

## 2021-06-18 NOTE — Plan of Care (Signed)
?  Problem: Education: ?Goal: Ability to identify signs and symptoms of gastrointestinal bleeding will improve ?Outcome: Progressing ?  ?Problem: Bowel/Gastric: ?Goal: Will show no signs and symptoms of gastrointestinal bleeding ?Outcome: Progressing ?  ?Problem: Fluid Volume: ?Goal: Will show no signs and symptoms of excessive bleeding ?Outcome: Progressing ?  ?Problem: Nutrition: ?Goal: Adequate nutrition will be maintained ?Outcome: Progressing ?  ?

## 2021-06-18 NOTE — Consult Note (Signed)
[facility] Consult ? ?Referring Provider: [facility] ?Primary Care Physician:  Alroy Dust, L.Marlou Sa, MD ?Primary Gastroenterologist: Dr. Alessandra Bevels ? ?Reason for Consultation: Melena ? ?HPI: Adam Ramos is a 61 y.o. male was in his usual state of health until 5 days ago when he noticed that his stools were black, liquid, like cold tar. ?He noticed black stools for 2 consecutive days, on the third day he continued to have liquid black stools but the frequency increased to 2-3 times a day. ?He subsequently went to his primary care physician and had labs drawn which showed that his hemoglobin was 11.7, hematocrit 33, MCV 96.5, platelet 127, elevated BUN of 41, creatinine 1.31, normal LFTs. ?That evening patient developed chest tightness, lightheadedness and exertional shortness of breath which prompted him to come to the ER yesterday. ?On further evaluation, he has hemoglobin was 6.5 with BUN of 33 and creatinine 1.26. ?He was admitted for melena, symptomatic anemia. ? ?Patient denies heartburn or acid reflux, takes Tums as needed, is not on a PPI. ?In April he had a urodynamic study which caused disturbance with urinary flow and pain and he was taking meloxicam 15 mg once a day every day for 14 days. ?Patient used to be on aspirin 81 mg daily which has not taken for a month. ?Patient denies use of Goody powders or other NSAIDs. ? ?Patient otherwise denies nausea or vomiting, unintentional weight loss, loss of appetite, difficulty swallowing, pain on swallowing, early satiety, bloating, change in bowel habits. ?No prior EGD. ?Colonoscopy 08/02/2020, Dr. Alessandra Bevels: Sessile serrated adenoma removed from sigmoid, rectum, descending(15 mm), large internal hemorrhoids and sigmoid diverticulosis, repeat recommended in 3 years. ?Colonoscopy also from 06/2009 ? ?No family history of bleeding disorder or GI malignancy. ?Patient does not smoke or use recreational drugs. ?He drinks alcohol very rarely, perhaps  once a year. ? ? ?Past Medical History:  ?Diagnosis Date  ? GERD (gastroesophageal reflux disease)   ? History of kidney stones   ? Nocturia   ? Prostate cancer [facility]) urologist--- dr Louis Meckel  ? s/p prostatectomy 05-17-2019  ? Urinary incontinence   ? Wears contact lenses   ? ? ?Past Surgical History:  ?Procedure Laterality Date  ? CYSTOSCOPY/URETEROSCOPY/HOLMIUM LASER/STENT PLACEMENT Left 02/19/2018  ? Procedure: LEFT URETEROSCOPY/HOLMIUM LASER/ STONE REMOVAL  STENT PLACEMENT;  Surgeon: Ardis Hughs, MD;  Location: [facility];  Service: Urology;  Laterality: Left;  ? EXTRACORPOREAL SHOCK WAVE LITHOTRIPSY Right 09/02/2016  ? Procedure: RIGHT EXTRACORPOREAL SHOCK WAVE LITHOTRIPSY (ESWL);  Surgeon: Nickie Retort, MD;  Location: [facility];  Service: Urology;  Laterality: Right;  ? EXTRACORPOREAL SHOCK WAVE LITHOTRIPSY  02-17-2014;  05-20-2011  '@[facility]'$   ? FOREIGN BODY REMOVAL N/A 09/02/2019  ? Procedure: CYSTOSCOPY FOREIGN BODY REMOVAL ADULT;  Surgeon: Ardis Hughs, MD;  Location: [facility];  Service: Urology;  Laterality: N/A;  ? HOLMIUM LASER APPLICATION Left 06/16/2561  ? Procedure: HOLMIUM LASER APPLICATION;  Surgeon: Ardis Hughs, MD;  Location: [facility];  Service: Urology;  Laterality: Left;  ? PELVIC LYMPH NODE DISSECTION Bilateral 05/17/2019  ? Procedure: PELVIC LYMPH NODE DISSECTION;  Surgeon: Ardis Hughs, MD;  Location: [facility];  Service: Urology;  Laterality: Bilateral;  ? ROBOT ASSISTED LAPAROSCOPIC RADICAL PROSTATECTOMY N/A 05/17/2019  ? Procedure: XI ROBOTIC ASSISTED LAPAROSCOPIC RADICAL PROSTATECTOMY;  Surgeon: Ardis Hughs, MD;  Location: [facility];  Service: Urology;  Laterality: N/A;  ? SHOULDER ARTHROSCOPY Right early 2000s  ? removal spurs  ? ? ?  Prior to Admission medications   ?Medication Sig Start Date End Date Taking? Authorizing Provider  ?acetaminophen (TYLENOL) 500 MG tablet Take 500 mg by mouth every 6 (six) hours as  needed for moderate pain.   Yes [provider]  ?docusate sodium (COLACE) 100 MG capsule Take 100 mg by mouth daily as needed for mild constipation.   Yes [provider]  ?trimethoprim (TRIMPEX) 100 MG tablet Take 100 mg by mouth daily.   Yes [provider]  ?phenazopyridine (PYRIDIUM) 200 MG tablet Take 1 tablet (200 mg total) by mouth 3 (three) times daily as needed for pain. ?Patient not taking: Reported on 06/17/2021 09/02/19   Ardis Hughs, MD  ?traMADol (ULTRAM) 50 MG tablet Take 1-2 tablets (50-100 mg total) by mouth every 6 (six) hours as needed for moderate pain. ?Patient not taking: Reported on 06/17/2021 09/02/19   Ardis Hughs, MD  ? ? ?Current Facility-Administered Medications  ?Medication Dose Route Frequency Provider Last Rate Last Admin  ? 0.9 %  sodium chloride infusion   Intravenous Continuous Marcelyn Bruins, MD 100 mL/hr at 06/18/21 0522 New Bag at 06/18/21 0522  ? acetaminophen (TYLENOL) tablet 650 mg  650 mg Oral Q6H PRN Marcelyn Bruins, MD   650 mg at 06/17/21 2232  ? Or  ? acetaminophen (TYLENOL) suppository 650 mg  650 mg Rectal Q6H PRN Marcelyn Bruins, MD      ? lip balm (CARMEX) ointment   Topical PRN Aline August, MD      ? pantoprazole (PROTONIX) injection 40 mg  40 mg Intravenous Q12H Marcelyn Bruins, MD      ? sodium chloride flush (NS) 0.9 % injection 3 mL  3 mL Intravenous Q12H Marcelyn Bruins, MD      ? trimethoprim (TRIMPEX) tablet 100 mg  100 mg Oral Daily Rise Patience, MD   100 mg at 06/17/21 2226  ? ? ?Allergies as of 06/17/2021  ? (No Known Allergies)  ? ? ?No family history on file. ? ?Social History  ? ?Socioeconomic History  ? Marital status: Single  ?  Spouse name: Not on file  ? Number of children: Not on file  ? Years of education: Not on file  ? Highest education level: Not on file  ?Occupational History  ? Not on file  ?Tobacco Use  ? Smoking status: Former  ?  Years: 4.00  ?  Types: Cigarettes  ?  Quit  date: 02/12/1983  ?  Years since quitting: 38.3  ? Smokeless tobacco: Never  ?Vaping Use  ? Vaping Use: Never used  ?Substance and Sexual Activity  ? Alcohol use: Yes  ?  Comment: occassional   ? Drug use: No  ? Sexual activity: Not on file  ?Other Topics Concern  ? Not on file  ?Social History Narrative  ? Not on file  ? ?Social Determinants of Health  ? ?Financial Resource Strain: Not on file  ?Food Insecurity: Not on file  ?Transportation Needs: Not on file  ?Physical Activity: Not on file  ?Stress: Not on file  ?Social Connections: Not on file  ?Intimate Partner Violence: Not on file  ? ? ?Review of Systems: Positive for: ?GI: Described in detail in HPI.    ?Gen: Denies any fever, chills, rigors, night sweats, anorexia, fatigue, weakness, malaise, involuntary weight loss, and sleep disorder ?CV:  chest pain, denies angina, palpitations, syncope, orthopnea, PND, peripheral edema, and claudication. ?Resp:  dyspnea, denies cough, sputum, wheezing, coughing  up blood. ?GU : urinary frequency, urinary hesitancy,  denies urinary burning, blood in urine, nocturnal urination and urinary incontinence. ?MS: Denies joint pain or swelling.  Denies muscle weakness, cramps, atrophy.  ?Derm: Denies rash, itching, oral ulcerations, hives, unhealing ulcers.  ?Psych: Denies depression, anxiety, memory loss, suicidal ideation, hallucinations,  and confusion. ?Heme: Denies bruising and enlarged lymph nodes. ?Neuro:  Denies any headaches, paresthesias. ?Endo:  dizziness,  denies any problems with DM, thyroid, adrenal function. ? ?Physical Exam: ?Vital signs in last 24 hours: ?Temp:  [97.8 ?F (36.6 ?C)-99.2 ?F (37.3 ?C)] 99 ?F (37.2 ?C) (05/08 0355) ?Pulse Rate:  [67-87] 69 (05/08 0752) ?Resp:  [15-21] 16 (05/08 0752) ?BP: (102-134)/(63-79) 102/67 (05/08 0752) ?SpO2:  [95 %-99 %] 96 % (05/08 0752) ?Weight:  [104.3 kg] 104.3 kg (05/07 2017) ?Last BM Date : 06/17/21 ? ?General:   Alert,  Well-developed, well-nourished, pleasant and  cooperative in NAD ?Head:  Normocephalic and atraumatic. ?Eyes:  Sclera clear, no icterus.   Prominent pallor ?Ears:  Normal auditory acuity. ?Nose:  No deformity, discharge,  or lesions. ?Mouth:  No deformity

## 2021-06-18 NOTE — TOC Progression Note (Signed)
Transition of Care (TOC) - Progression Note  ? ? ?Patient Details  ?Name: Adam Ramos ?MRN: [id number] ?Date of Birth: 1960/10/12 ? ?Transition of Care (TOC) CM/SW Contact  ?Purcell Mouton, RN ?Phone Number: ?06/18/2021, 3:41 PM ? ?Clinical Narrative:    ? ?Spoke with pt to explain that he should call the number on the back of his insurance card for a PCP in Network with [employer]. Pt states that he will.  ? ?  ?  ? ?Expected Discharge Plan and Services ?  ?  ?  ?  ?  ?                ?  ?  ?  ?  ?  ?  ?  ?  ?  ?  ? ? ?Social Determinants of Health (SDOH) Interventions ?  ? ?Readmission Risk Interventions ?   ? View : No data to display.  ?  ?  ?  ? ? ?

## 2021-06-18 NOTE — Anesthesia Procedure Notes (Signed)
Procedure Name: [place] ?Date/Time: 06/18/2021 1:10 PM ?Performed by: Victoriano Lain, CRNA ?Pre-anesthesia Checklist: Patient identified, Emergency Drugs available, Suction available, Patient being monitored and Timeout performed ?Patient Re-evaluated:Patient Re-evaluated prior to induction ?Oxygen Delivery Method: Simple face mask ?Dental Injury: Teeth and Oropharynx as per pre-operative assessment  ? ? ? ? ?

## 2021-06-18 NOTE — TOC Progression Note (Signed)
Transition of Care (TOC) - Progression Note  ? ? ?Patient Details  ?Name: Chaske C Martinique ?MRN: [id number] ?Date of Birth: [birth year] ? ?Transition of Care (TOC) CM/SW Contact  ?Purcell Mouton, RN ?Phone Number: ?06/18/2021, 3:08 PM ? ?Clinical Narrative:    ? ? ?Transition of Care (TOC) Screening Note ? ? ?Patient Details  ?Name: Hence C Martinique ?Date of Birth: 1961/02/05 ? ? ?Transition of Care (TOC) CM/SW Contact:    ?Purcell Mouton, RN ?Phone Number: ?06/18/2021, 3:08 PM ? ? ? ?Transition of Care Department [facility]) has reviewed patient and no TOC needs have been identified at this time. We will continue to monitor patient advancement through interdisciplinary progression rounds. If new patient transition needs arise, please place a TOC consult. ?  ? ?  ?  ? ?Expected Discharge Plan and Services ?  ?  ?  ?  ?  ?                ?  ?  ?  ?  ?  ?  ?  ?  ?  ?  ? ? ?Social Determinants of Health (SDOH) Interventions ?  ? ?Readmission Risk Interventions ?   ? View : No data to display.  ?  ?  ?  ? ? ?

## 2021-06-18 NOTE — Progress Notes (Signed)
Patient has received 2 unit PRBC transfusion. ?Hemoglobin has increased from 6.5-7.5. ?He is not tachycardic or hypotensive and is optimized for EGD with propofol. ?

## 2021-06-19 ENCOUNTER — Inpatient Hospital Stay (HOSPITAL_COMMUNITY): Payer: BC Managed Care – PPO

## 2021-06-19 ENCOUNTER — Encounter (HOSPITAL_COMMUNITY): Payer: Self-pay | Admitting: Gastroenterology

## 2021-06-19 DIAGNOSIS — K2980 Duodenitis without bleeding: Secondary | ICD-10-CM | POA: Diagnosis present

## 2021-06-19 DIAGNOSIS — N179 Acute kidney failure, unspecified: Secondary | ICD-10-CM | POA: Diagnosis present

## 2021-06-19 DIAGNOSIS — R609 Edema, unspecified: Secondary | ICD-10-CM | POA: Diagnosis not present

## 2021-06-19 DIAGNOSIS — Z87440 Personal history of urinary (tract) infections: Secondary | ICD-10-CM | POA: Diagnosis not present

## 2021-06-19 DIAGNOSIS — K222 Esophageal obstruction: Secondary | ICD-10-CM | POA: Diagnosis present

## 2021-06-19 DIAGNOSIS — K5730 Diverticulosis of large intestine without perforation or abscess without bleeding: Secondary | ICD-10-CM | POA: Diagnosis present

## 2021-06-19 DIAGNOSIS — K8020 Calculus of gallbladder without cholecystitis without obstruction: Secondary | ICD-10-CM | POA: Diagnosis present

## 2021-06-19 DIAGNOSIS — K264 Chronic or unspecified duodenal ulcer with hemorrhage: Secondary | ICD-10-CM | POA: Diagnosis present

## 2021-06-19 DIAGNOSIS — K921 Melena: Secondary | ICD-10-CM | POA: Diagnosis not present

## 2021-06-19 DIAGNOSIS — N200 Calculus of kidney: Secondary | ICD-10-CM | POA: Diagnosis present

## 2021-06-19 DIAGNOSIS — Z9079 Acquired absence of other genital organ(s): Secondary | ICD-10-CM | POA: Diagnosis not present

## 2021-06-19 DIAGNOSIS — E8770 Fluid overload, unspecified: Secondary | ICD-10-CM | POA: Diagnosis present

## 2021-06-19 DIAGNOSIS — K219 Gastro-esophageal reflux disease without esophagitis: Secondary | ICD-10-CM | POA: Diagnosis present

## 2021-06-19 DIAGNOSIS — K922 Gastrointestinal hemorrhage, unspecified: Secondary | ICD-10-CM | POA: Diagnosis present

## 2021-06-19 DIAGNOSIS — Z87891 Personal history of nicotine dependence: Secondary | ICD-10-CM | POA: Diagnosis not present

## 2021-06-19 DIAGNOSIS — D696 Thrombocytopenia, unspecified: Secondary | ICD-10-CM

## 2021-06-19 DIAGNOSIS — N390 Urinary tract infection, site not specified: Secondary | ICD-10-CM | POA: Diagnosis present

## 2021-06-19 DIAGNOSIS — Z853 Personal history of malignant neoplasm of breast: Secondary | ICD-10-CM | POA: Diagnosis not present

## 2021-06-19 DIAGNOSIS — D72829 Elevated white blood cell count, unspecified: Secondary | ICD-10-CM | POA: Diagnosis not present

## 2021-06-19 DIAGNOSIS — C61 Malignant neoplasm of prostate: Secondary | ICD-10-CM | POA: Diagnosis not present

## 2021-06-19 DIAGNOSIS — Z79899 Other long term (current) drug therapy: Secondary | ICD-10-CM | POA: Diagnosis not present

## 2021-06-19 DIAGNOSIS — D62 Acute posthemorrhagic anemia: Secondary | ICD-10-CM

## 2021-06-19 DIAGNOSIS — Z8546 Personal history of malignant neoplasm of prostate: Secondary | ICD-10-CM | POA: Diagnosis not present

## 2021-06-19 LAB — PREPARE RBC (CROSSMATCH)

## 2021-06-19 LAB — CBC WITH DIFFERENTIAL/PLATELET
Abs Immature Granulocytes: 0.07 10*3/uL (ref 0.00–0.07)
Basophils Absolute: 0 10*3/uL (ref 0.0–0.1)
Basophils Relative: 0 %
Eosinophils Absolute: 0.2 10*3/uL (ref 0.0–0.5)
Eosinophils Relative: 4 %
HCT: 19.6 % — ABNORMAL LOW (ref 39.0–52.0)
Hemoglobin: 7 g/dL — ABNORMAL LOW (ref 13.0–17.0)
Immature Granulocytes: 1 %
Lymphocytes Relative: 25 %
Lymphs Abs: 1.4 10*3/uL (ref 0.7–4.0)
MCH: 34.7 pg — ABNORMAL HIGH (ref 26.0–34.0)
MCHC: 35.7 g/dL (ref 30.0–36.0)
MCV: 97 fL (ref 80.0–100.0)
Monocytes Absolute: 0.3 10*3/uL (ref 0.1–1.0)
Monocytes Relative: 6 %
Neutro Abs: 3.6 10*3/uL (ref 1.7–7.7)
Neutrophils Relative %: 64 %
Platelets: 91 10*3/uL — ABNORMAL LOW (ref 150–400)
RBC: 2.02 MIL/uL — ABNORMAL LOW (ref 4.22–5.81)
RDW: 20.9 % — ABNORMAL HIGH (ref 11.5–15.5)
WBC: 5.6 10*3/uL (ref 4.0–10.5)
nRBC: 1.4 % — ABNORMAL HIGH (ref 0.0–0.2)

## 2021-06-19 LAB — BASIC METABOLIC PANEL
Anion gap: 4 — ABNORMAL LOW (ref 5–15)
BUN: 24 mg/dL — ABNORMAL HIGH (ref 8–23)
CO2: 24 mmol/L (ref 22–32)
Calcium: 8.2 mg/dL — ABNORMAL LOW (ref 8.9–10.3)
Chloride: 112 mmol/L — ABNORMAL HIGH (ref 98–111)
Creatinine, Ser: 1.61 mg/dL — ABNORMAL HIGH (ref 0.61–1.24)
GFR, Estimated: 48 mL/min — ABNORMAL LOW (ref >60)
Glucose, Bld: 103 mg/dL — ABNORMAL HIGH (ref 70–99)
Potassium: 3.7 mmol/L (ref 3.5–5.1)
Sodium: 140 mmol/L (ref 135–145)

## 2021-06-19 LAB — SURGICAL PATHOLOGY

## 2021-06-19 LAB — HEMOGLOBIN AND HEMATOCRIT, BLOOD
HCT: 24.5 % — ABNORMAL LOW (ref 39.0–52.0)
Hemoglobin: 8.6 g/dL — ABNORMAL LOW (ref 13.0–17.0)

## 2021-06-19 LAB — URINE CULTURE: Culture: NO GROWTH

## 2021-06-19 LAB — MAGNESIUM: Magnesium: 2.2 mg/dL (ref 1.7–2.4)

## 2021-06-19 SURGERY — IMAGING PROCEDURE, GI TRACT, INTRALUMINAL, VIA CAPSULE
Anesthesia: LOCAL

## 2021-06-19 MED ORDER — PANTOPRAZOLE SODIUM 40 MG PO TBEC: 40.0000 mg | DELAYED_RELEASE_TABLET | Freq: Two times a day (BID) | ORAL | Status: DC

## 2021-06-19 MED ORDER — SODIUM CHLORIDE 0.9% IV SOLUTION: Freq: Once | INTRAVENOUS | Status: AC

## 2021-06-19 MED ORDER — PANTOPRAZOLE SODIUM 40 MG IV SOLR
40.0000 mg | Freq: Two times a day (BID) | INTRAVENOUS | Status: DC
  Administered 2021-06-19 – 2021-06-21 (×5): 40 mg via INTRAVENOUS
  Filled 2021-06-19 (×5): qty 10

## 2021-06-19 NOTE — H&P (View-Only) (Signed)
[facility] Progress Note ? ?Adam Ramos 61 y.o. Oct 03, 1960 ? ?CC:  Melena, anemia ? ? ?Subjective: ?Patient seen and examined laying in bed comfortably.  Notes continued to have melena this morning.  He also short of breath when walking to the bathroom.  Tolerated breakfast well this morning.  Hemoglobin is 7.0 plan for 1 unit packed red blood cell transfusion. ? ?ROS : Review of Systems  ?Constitutional:  Positive for malaise/fatigue.  ?Respiratory:  Positive for shortness of breath.   ?Gastrointestinal:  Positive for melena. Negative for abdominal pain, blood in stool, constipation, diarrhea, heartburn, nausea and vomiting.  ?Genitourinary:  Negative for dysuria and urgency.   ? ? ?Objective: ?Vital signs in last 24 hours: ?Vitals:  ? 06/18/21 2053 06/19/21 0549  ?BP: 115/64 107/66  ?Pulse: 75 60  ?Resp: 20 18  ?Temp: 99.1 ?F (37.3 ?C) 97.8 ?F (36.6 ?C)  ?SpO2: 97% 98%  ? ? ?Physical Exam: ? ?General:  Alert, cooperative, no distress, appears stated age, skin is pale  ?Head:  Normocephalic, without obvious abnormality, atraumatic  ?Eyes:  Anicteric sclera, EOM's intact, conjunctival pallor  ?Lungs:   Clear to auscultation bilaterally, respirations unlabored  ?Heart:  Regular rate and rhythm, S1, S2 normal  ?Abdomen:   Soft, non-tender, bowel sounds active all four quadrants,  no masses,   ?Extremities: Extremities normal, atraumatic, no  edema  ?Pulses: 2+ and symmetric  ? ? ?Lab Results: ?Recent Labs  ?  06/18/21 ?1004 06/19/21 ?0419  ?NA 140 140  ?K 3.6 3.7  ?CL 113* 112*  ?CO2 22 24  ?GLUCOSE 103* 103*  ?BUN 22 24*  ?CREATININE 1.26* 1.61*  ?CALCIUM 8.3* 8.2*  ?MG  --  2.2  ? ?Recent Labs  ?  06/17/21 ?1532  ?AST 21  ?ALT 20  ?ALKPHOS 54  ?BILITOT 0.6  ?PROT 5.5*  ?ALBUMIN 3.5  ? ?Recent Labs  ?  06/17/21 ?1532 06/18/21 ?1004 06/19/21 ?0419  ?WBC 11.0* 7.2 5.6  ?NEUTROABS 8.0*  --  3.6  ?HGB 6.5* 7.5* 7.0*  ?HCT 17.9* 20.8* 19.6*  ?MCV 100.6* 95.9 97.0  ?PLT 113* 109* 91*  ? ?No results for  input(s): LABPROT, INR in the last 72 hours. ? ? ? ?Assessment ?Melena ?Anemia ?Shortness of breath ? ?Hemoglobin 7.0.  Yesterday was 7.5 after 2 units packed red blood cells.  Notes baseline hemoglobin at 11.7 1 week ago.  ? ?EGD 06/18/2021 ?Benign esophageal stenosis ?Acute duodenitis ?Nonbleeding duodenal ulcers Forrest class III.  Biopsies taken for H. pylori testing.  Pending. ? ?Patient continues to have melena and decreasing hemoglobin.  Possible upper GI bleed from small intestine.  BUN elevated at 24.  ? ?Patient has history of meloxicam and aspirin 81 mg use. ? ?Patient complains of new shortness of breath when walking to the bathroom.  Lung sounds clear bilaterally.  No evidence of lower extremity edema.  Possible due to worsening anemia.  Chest x-ray has been ordered for dyspnea. ? ?Plan: ?Continue supportive care ?Plan for capsule endoscopy today to evaluate for further causes of anemia and melena.  ?Patient n.p.o. plan for capsule endoscopy after 6 hours of n.p.o. status. ?Start Protonix 40 mg IV twice daily ?Continue with plan for transfusion of 1 unit packed red blood cells today.  We will monitor posttransfusion hemoglobin. ?We will follow surgical pathology reports for H. pylori testing. ?[facility] will follow. ?Charlott Rakes PA-C ?06/19/2021, 9:39 AM ? ?Contact #  [phone] ? ?

## 2021-06-19 NOTE — Progress Notes (Signed)
[facility] Progress Note ? ?Adam Ramos 61 y.o. 1961/01/27 ? ?CC:  Melena, anemia ? ? ?Subjective: ?Patient seen and examined laying in bed comfortably.  Notes continued to have melena this morning.  He also short of breath when walking to the bathroom.  Tolerated breakfast well this morning.  Hemoglobin is 7.0 plan for 1 unit packed red blood cell transfusion. ? ?ROS : Review of Systems  ?Constitutional:  Positive for malaise/fatigue.  ?Respiratory:  Positive for shortness of breath.   ?Gastrointestinal:  Positive for melena. Negative for abdominal pain, blood in stool, constipation, diarrhea, heartburn, nausea and vomiting.  ?Genitourinary:  Negative for dysuria and urgency.   ? ? ?Objective: ?Vital signs in last 24 hours: ?Vitals:  ? 06/18/21 2053 06/19/21 0549  ?BP: 115/64 107/66  ?Pulse: 75 60  ?Resp: 20 18  ?Temp: 99.1 ?F (37.3 ?C) 97.8 ?F (36.6 ?C)  ?SpO2: 97% 98%  ? ? ?Physical Exam: ? ?General:  Alert, cooperative, no distress, appears stated age, skin is pale  ?Head:  Normocephalic, without obvious abnormality, atraumatic  ?Eyes:  Anicteric sclera, EOM's intact, conjunctival pallor  ?Lungs:   Clear to auscultation bilaterally, respirations unlabored  ?Heart:  Regular rate and rhythm, S1, S2 normal  ?Abdomen:   Soft, non-tender, bowel sounds active all four quadrants,  no masses,   ?Extremities: Extremities normal, atraumatic, no  edema  ?Pulses: 2+ and symmetric  ? ? ?Lab Results: ?Recent Labs  ?  06/18/21 ?1004 06/19/21 ?0419  ?NA 140 140  ?K 3.6 3.7  ?CL 113* 112*  ?CO2 22 24  ?GLUCOSE 103* 103*  ?BUN 22 24*  ?CREATININE 1.26* 1.61*  ?CALCIUM 8.3* 8.2*  ?MG  --  2.2  ? ?Recent Labs  ?  06/17/21 ?1532  ?AST 21  ?ALT 20  ?ALKPHOS 54  ?BILITOT 0.6  ?PROT 5.5*  ?ALBUMIN 3.5  ? ?Recent Labs  ?  06/17/21 ?1532 06/18/21 ?1004 06/19/21 ?0419  ?WBC 11.0* 7.2 5.6  ?NEUTROABS 8.0*  --  3.6  ?HGB 6.5* 7.5* 7.0*  ?HCT 17.9* 20.8* 19.6*  ?MCV 100.6* 95.9 97.0  ?PLT 113* 109* 91*  ? ?No results for  input(s): LABPROT, INR in the last 72 hours. ? ? ? ?Assessment ?Melena ?Anemia ?Shortness of breath ? ?Hemoglobin 7.0.  Yesterday was 7.5 after 2 units packed red blood cells.  Notes baseline hemoglobin at 11.7 1 week ago.  ? ?EGD 06/18/2021 ?Benign esophageal stenosis ?Acute duodenitis ?Nonbleeding duodenal ulcers Forrest class III.  Biopsies taken for H. pylori testing.  Pending. ? ?Patient continues to have melena and decreasing hemoglobin.  Possible upper GI bleed from small intestine.  BUN elevated at 24.  ? ?Patient has history of meloxicam and aspirin 81 mg use. ? ?Patient complains of new shortness of breath when walking to the bathroom.  Lung sounds clear bilaterally.  No evidence of lower extremity edema.  Possible due to worsening anemia.  Chest x-ray has been ordered for dyspnea. ? ?Plan: ?Continue supportive care ?Plan for capsule endoscopy today to evaluate for further causes of anemia and melena.  ?Patient n.p.o. plan for capsule endoscopy after 6 hours of n.p.o. status. ?Start Protonix 40 mg IV twice daily ?Continue with plan for transfusion of 1 unit packed red blood cells today.  We will monitor posttransfusion hemoglobin. ?We will follow surgical pathology reports for H. pylori testing. ?[facility] will follow. ?Charlott Rakes PA-C ?06/19/2021, 9:39 AM ? ?Contact #  [phone] ? ?

## 2021-06-19 NOTE — Progress Notes (Signed)
?PROGRESS NOTE ? ? ? ?Adam Ramos  ZOX:[id number] DOB: [birth year] DOA: 06/17/2021 ?PCP: Alroy Dust, L.Marlou Sa, MD  ? ?Brief Narrative:  ?61 y.o. male with medical history significant of prostate cancer status post prostatectomy in 2021, GERD, diverticulosis, renal stones presented with black stools.  On presentation, hemoglobin was 6.5.  CT of abdomen and pelvis showed bilateral nonobstructing renal calculi, cholelithiasis and diverticulosis.  He was started on IV Protonix and 2 units of packed red cells were transfused.  GI was consulted.  He underwent EGD on 06/18/2021 ? ?Assessment & Plan: ?  ?Possible upper GI bleeding presenting with melena ?Acute blood loss anemia ?-Presented with melena and hemoglobin of 6.5.  Status post 2 units packed red cells transfusion.  Monitor H&H.  Hemoglobin 7 today.  Will transfuse 1 unit packed red cells today.  IV Protonix has been switched to Protonix twice a day orally by GI.  ?-Status post EGD on 06/18/2021 which showed benign-appearing esophageal stenosis; acute duodenitis; nonbleeding duodenal ulcers with a clean ulcer base; biopsies pending.  GI recommends Protonix 40 mg twice daily for 2 months with no aspirin, ibuprofen, naproxen or other NSAIDs ?-Had black stools this morning again. ? ?Dyspnea ?-We will check chest x-ray. ? ?Thrombocytopenia ?-Questionable cause.  Has had chronic thrombocytopenia.  Monitor ? ?Leukocytosis ?-Mild.  Resolved ? ?Recurrent UTI ?-Currently on trimethoprim.  Will DC since trimethoprim can cause anemia/thrombocytopenia ? ?GERD ?-Continue PPI ? ?History of breast cancer status postresection in 2021 ?-Outpatient follow-up ? ? ? ?DVT prophylaxis: SCDs ?Code Status: Full ?Family Communication: Sister at bedside on 06/19/2021 ?Disposition Plan: ?Status is: Observation ?The patient will require care spanning > 2 midnights and should be moved to inpatient because: Need for another blood transfusion today.  H&H needs to be monitored. ? ?Consultants:  GI ? ?Procedures:  ?ECG on 06/18/2021 ?Impression:               - Normal upper third of esophagus and middle third  ?                          of esophagus. ?                          - Benign-appearing esophageal stenosis.Traversed  ?                          with minimal resistance at the GE junction. ?                          - Normal stomach. ?                          - Acute duodenitis. ?                          - Non-bleeding duodenal ulcers with a clean ulcer  ?                          base (Forrest Class III). ?                          - Normal second portion of the duodenum and third  ?  portion of the duodenum. ?                          - Biopsies were taken with a cold forceps for  ?                          Helicobacter pylori testing. ?Moderate Sedation: ?     Patient did not receive moderate sedation for this procedure, but  ?     instead received monitored anesthesia care. ?Recommendation:           - Resume regular diet. ?                          - Continue present medications. ?                          - Await pathology results. ?                          - Use Protonix (pantoprazole) 40 mg PO BID for 2  ?                          months. ?                          - No aspirin, ibuprofen, naproxen, or other  ?                          non-steroidal anti-inflammatory drugs. ? ?Antimicrobials:  ?Patient seen and examined at bedside.  No fever, nausea, vomiting or worsening abdominal pain reported.  Has had intermittent shortness of breath.  Still feels weak.  Had black stool again this morning. ? ?Subjective: ? ? ?Objective: ?Vitals:  ? 06/18/21 1402 06/18/21 2053 06/19/21 0500 06/19/21 0549  ?BP: 110/70 115/64  107/66  ?Pulse: 65 75  60  ?Resp: '16 20  18  '$ ?Temp: 98.2 ?F (36.8 ?C) 99.1 ?F (37.3 ?C)  97.8 ?F (36.6 ?C)  ?TempSrc: Oral Oral  Oral  ?SpO2: 96% 97%  98%  ?Weight:   108 kg   ?Height:      ? ? ?Intake/Output Summary (Last 24 hours) at 06/19/2021 0747 ?Last data  filed at 06/18/2021 1530 ?Gross per 24 hour  ?Intake 2613.39 ml  ?Output --  ?Net 2613.39 ml  ? ? ?Filed Weights  ? 06/17/21 2017 06/18/21 1220 06/19/21 0500  ?Weight: 104.3 kg 104.6 kg 108 kg  ? ? ?Examination: ? ?General: On room air.  No distress ?ENT/neck: No thyromegaly.  JVD is not elevated  ?respiratory: Decreased breath sounds at bases bilaterally with some crackles; no wheezing CVS: S1-S2 heard, rate controlled currently ?Abdominal: Soft, nontender, slightly distended; no organomegaly, normal bowel sounds are heard ?Extremities: Trace lower extremity edema; no cyanosis  ?CNS: Awake and alert.  No focal neurologic deficit.  Moves extremities ?Lymph: No obvious lymphadenopathy ?Skin: No obvious ecchymosis/lesions  ?psych: Flat affect.  No agitation currently.   ?Musculoskeletal: No obvious joint swelling/deformity ? ? ? ?Data Reviewed: I have personally reviewed following labs and imaging studies ? ?CBC: ?Recent Labs  ?Lab 06/17/21 ?1532 06/18/21 ?1004 06/19/21 ?0419  ?WBC 11.0* 7.2 5.6  ?NEUTROABS 8.0*  --  3.6  ?HGB 6.5* 7.5*  7.0*  ?HCT 17.9* 20.8* 19.6*  ?MCV 100.6* 95.9 97.0  ?PLT 113* 109* 91*  ? ? ?Basic Metabolic Panel: ?Recent Labs  ?Lab 06/17/21 ?1532 06/18/21 ?1004 06/19/21 ?0419  ?NA 136 140 140  ?K 3.6 3.6 3.7  ?CL 107 113* 112*  ?CO2 '22 22 24  '$ ?GLUCOSE 133* 103* 103*  ?BUN 33* 22 24*  ?CREATININE 1.26* 1.26* 1.61*  ?CALCIUM 9.1 8.3* 8.2*  ?MG  --   --  2.2  ? ? ?GFR: ?Estimated Creatinine Clearance: 59.6 mL/min (A) (by C-G formula based on SCr of 1.61 mg/dL (H)). ?Liver Function Tests: ?Recent Labs  ?Lab 06/17/21 ?1532  ?AST 21  ?ALT 20  ?ALKPHOS 54  ?BILITOT 0.6  ?PROT 5.5*  ?ALBUMIN 3.5  ? ? ?Recent Labs  ?Lab 06/17/21 ?1532  ?LIPASE 57*  ? ? ?No results for input(s): AMMONIA in the last 168 hours. ?Coagulation Profile: ?No results for input(s): INR, PROTIME in the last 168 hours. ?Cardiac Enzymes: ?No results for input(s): CKTOTAL, CKMB, CKMBINDEX, TROPONINI in the last 168 hours. ?BNP (last 3  results) ?No results for input(s): PROBNP in the last 8760 hours. ?HbA1C: ?No results for input(s): HGBA1C in the last 72 hours. ?CBG: ?No results for input(s): GLUCAP in the last 168 hours. ?Lipid Profile: ?No results for input(s): CHOL, HDL, LDLCALC, TRIG, CHOLHDL, LDLDIRECT in the last 72 hours. ?Thyroid Function Tests: ?No results for input(s): TSH, T4TOTAL, FREET4, T3FREE, THYROIDAB in the last 72 hours. ?Anemia Panel: ?Recent Labs  ?  06/17/21 ?1532 06/17/21 ?2029  ?FERRITIN  --  55  ?TIBC  --  311  ?IRON  --  101  ?RETICCTPCT 8.9*  --   ? ? ?Sepsis Labs: ?No results for input(s): PROCALCITON, LATICACIDVEN in the last 168 hours. ? ?No results found for this or any previous visit (from the past 240 hour(s)).  ? ? ? ? ? ?Radiology Studies: ?CT Abdomen Pelvis Wo Contrast ? ?Result Date: 06/17/2021 ?CLINICAL DATA:  Abdominal pain. EXAM: CT ABDOMEN AND PELVIS WITHOUT CONTRAST TECHNIQUE: Multidetector CT imaging of the abdomen and pelvis was performed following the standard protocol without IV contrast. RADIATION DOSE REDUCTION: This exam was performed according to the departmental dose-optimization program which includes automated exposure control, adjustment of the mA and/or kV according to patient size and/or use of iterative reconstruction technique. COMPARISON:  March 28, 2020 and April 19, 2011 FINDINGS: Lower chest: No acute abnormality. Hepatobiliary: No focal liver abnormality is seen. A small layer of tiny gallstones is seen within the dependent portion of an otherwise normal-appearing gallbladder. There is no evidence of biliary dilatation. Pancreas: Unremarkable. No pancreatic ductal dilatation or surrounding inflammatory changes. Spleen: Normal in size without focal abnormality. Adrenals/Urinary Tract: Adrenal glands are unremarkable. Kidneys are normal in size, without obstructing renal calculi, focal lesion, or hydronephrosis. Mild prominence of an extrarenal pelvis is seen on the left. A 2 mm  nonobstructing renal calculus is seen within the mid right kidney. 2 mm, 3 mm, 4 mm and 5 mm nonobstructing renal calculi are seen within the mid left kidney. Bladder is unremarkable. Stomach/Bowel: Stomach is within nor

## 2021-06-20 ENCOUNTER — Inpatient Hospital Stay (HOSPITAL_COMMUNITY): Payer: BC Managed Care – PPO

## 2021-06-20 ENCOUNTER — Inpatient Hospital Stay (HOSPITAL_COMMUNITY): Payer: BC Managed Care – PPO | Admitting: Certified Registered Nurse Anesthetist

## 2021-06-20 ENCOUNTER — Encounter (HOSPITAL_COMMUNITY)
Admission: EM | Disposition: A | Discharge status: Home / Self Care | Payer: Self-pay | Source: Home / Self Care | Attending: Internal Medicine

## 2021-06-20 ENCOUNTER — Encounter (HOSPITAL_COMMUNITY): Payer: Self-pay | Admitting: Internal Medicine

## 2021-06-20 DIAGNOSIS — K922 Gastrointestinal hemorrhage, unspecified: Secondary | ICD-10-CM

## 2021-06-20 DIAGNOSIS — N179 Acute kidney failure, unspecified: Secondary | ICD-10-CM

## 2021-06-20 DIAGNOSIS — R609 Edema, unspecified: Secondary | ICD-10-CM

## 2021-06-20 HISTORY — PX: GIVENS CAPSULE STUDY: SHX5432

## 2021-06-20 HISTORY — PX: ESOPHAGOGASTRODUODENOSCOPY (EGD) WITH PROPOFOL: SHX5813

## 2021-06-20 LAB — TYPE AND SCREEN
ABO/RH(D): A POS
Antibody Screen: NEGATIVE
Unit division: 0
Unit division: 0
Unit division: 0

## 2021-06-20 LAB — BPAM RBC
Blood Product Expiration Date: 202305142359
Blood Product Expiration Date: 202305232359
Blood Product Expiration Date: 202305242359
ISSUE DATE / TIME: 202305080153
ISSUE DATE / TIME: 202305080506
ISSUE DATE / TIME: 202305091053
Unit Type and Rh: 6200
Unit Type and Rh: 6200
Unit Type and Rh: 6200

## 2021-06-20 LAB — URINALYSIS, ROUTINE W REFLEX MICROSCOPIC
Bilirubin Urine: NEGATIVE
Glucose, UA: NEGATIVE mg/dL
Hgb urine dipstick: NEGATIVE
Ketones, ur: NEGATIVE mg/dL
Leukocytes,Ua: NEGATIVE
Nitrite: NEGATIVE
Protein, ur: NEGATIVE mg/dL
Specific Gravity, Urine: 1.005 (ref 1.005–1.030)
pH: 6 (ref 5.0–8.0)

## 2021-06-20 SURGERY — ESOPHAGOGASTRODUODENOSCOPY (EGD) WITH PROPOFOL
Anesthesia: Monitor Anesthesia Care

## 2021-06-20 MED ORDER — LACTATED RINGERS IV SOLN: INTRAVENOUS | Status: DC

## 2021-06-20 MED ORDER — SODIUM CHLORIDE 0.9 % IV SOLN: INTRAVENOUS | Status: DC

## 2021-06-20 MED ORDER — PROPOFOL 10 MG/ML IV BOLUS
INTRAVENOUS | Status: DC | PRN
  Administered 2021-06-20: 80 mg via INTRAVENOUS
  Administered 2021-06-20: 70 mg via INTRAVENOUS
  Administered 2021-06-20 (×2): 50 mg via INTRAVENOUS

## 2021-06-20 MED ORDER — EPHEDRINE SULFATE-NACL 50-0.9 MG/10ML-% IV SOSY
PREFILLED_SYRINGE | INTRAVENOUS | Status: DC | PRN
  Administered 2021-06-20: 10 mg via INTRAVENOUS

## 2021-06-20 MED ORDER — PROPOFOL 10 MG/ML IV BOLUS
INTRAVENOUS | Status: AC
  Filled 2021-06-20: qty 20

## 2021-06-20 SURGICAL SUPPLY — 16 items
BLOCK BITE 60FR ADLT L/F BLUE (MISCELLANEOUS) ×2 IMPLANT
ELECT REM PT RETURN 9FT ADLT (ELECTROSURGICAL)
ELECTRODE REM PT RTRN 9FT ADLT (ELECTROSURGICAL) IMPLANT
FORCEP RJ3 GP 1.8X160 W-NEEDLE (CUTTING FORCEPS) IMPLANT
FORCEPS BIOP RAD 4 LRG CAP 4 (CUTTING FORCEPS) IMPLANT
NDL SCLEROTHERAPY 25GX240 (NEEDLE) IMPLANT
NEEDLE SCLEROTHERAPY 25GX240 (NEEDLE) IMPLANT
PROBE APC STR FIRE (PROBE) IMPLANT
PROBE INJECTION GOLD (MISCELLANEOUS)
PROBE INJECTION GOLD 7FR (MISCELLANEOUS) IMPLANT
SNARE SHORT THROW 13M SML OVAL (MISCELLANEOUS) IMPLANT
SYR 50ML LL SCALE MARK (SYRINGE) IMPLANT
TOWEL COTTON PACK 4EA (MISCELLANEOUS) ×4 IMPLANT
TUBING ENDO SMARTCAP PENTAX (MISCELLANEOUS) ×4 IMPLANT
TUBING IRRIGATION ENDOGATOR (MISCELLANEOUS) ×2 IMPLANT
WATER STERILE IRR 1000ML POUR (IV SOLUTION) IMPLANT

## 2021-06-20 NOTE — Transfer of Care (Signed)
Immediate Anesthesia Transfer of Care Note ? ?Patient: Adam Ramos ? ?Procedure(s) Performed: ESOPHAGOGASTRODUODENOSCOPY (EGD) WITH PROPOFOL ?GIVENS CAPSULE STUDY ? ?Patient Location: PACU and Endoscopy Unit ? ?Anesthesia Type:MAC ? ?Level of Consciousness: drowsy ? ?Airway & Oxygen Therapy: Patient Spontanous Breathing ? ?Post-op Assessment: Report given to RN and Post -op Vital signs reviewed and stable ? ?Post vital signs: Reviewed and stable ? ?Last Vitals:  ?Vitals Value Taken Time  ?BP    ?Temp    ?Pulse 64 06/20/21 1242  ?Resp 22 06/20/21 1242  ?SpO2 92 % 06/20/21 1242  ?Vitals shown include unvalidated device data. ? ?Last Pain:  ?Vitals:  ? 06/20/21 1113  ?TempSrc: Temporal  ?PainSc: 0-No pain  ?   ? ?Patients Stated Pain Goal: 0 (06/18/21 2117) ? ?Complications: No notable events documented. ?

## 2021-06-20 NOTE — Anesthesia Preprocedure Evaluation (Signed)
Anesthesia Evaluation  ?Patient identified by MRN, date of birth, ID band ?Patient awake ? ? ? ?Reviewed: ?Allergy & Precautions, NPO status , Patient's Chart, lab work & pertinent test results ? ?Airway ?Mallampati: II ? ?TM Distance: >3 FB ?Neck ROM: Full ? ? ? Dental ?no notable dental hx. ? ?  ?Pulmonary ?neg pulmonary ROS, former smoker,  ?  ?Pulmonary exam normal ? ? ? ? ? ? ? Cardiovascular ?negative cardio ROS ? ? ?Rhythm:Regular Rate:Normal ? ? ?  ?Neuro/Psych ?negative neurological ROS ? negative psych ROS  ? GI/Hepatic ?Neg liver ROS, GERD  ,  ?Endo/Other  ?negative endocrine ROS ? Renal/GU ?negative Renal ROS  ?negative genitourinary ?  ?Musculoskeletal ?negative musculoskeletal ROS ?(+)  ? Abdominal ?Normal abdominal exam  (+)   ?Peds ? Hematology ? ?(+) Blood dyscrasia, anemia ,   ?Anesthesia Other Findings ? ? Reproductive/Obstetrics ? ?  ? ? ? ? ? ? ? ? ? ? ? ? ? ?  ?  ? ? ? ? ? ? ? ?Anesthesia Physical ?Anesthesia Plan ? ?ASA: 2 ? ?Anesthesia Plan: MAC  ? ?Post-op Pain Management:   ? ?Induction: Intravenous ? ?PONV Risk Score and Plan: 1 and Propofol infusion and Treatment may vary due to age or medical condition ? ?Airway Management Planned: Simple Face Mask, Natural Airway and Nasal Cannula ? ?Additional Equipment: None ? ?Intra-op Plan:  ? ?Post-operative Plan: Extubation in OR ? ?Informed Consent: I have reviewed the patients History and Physical, chart, labs and discussed the procedure including the risks, benefits and alternatives for the proposed anesthesia with the patient or authorized representative who has indicated his/her understanding and acceptance.  ? ? ? ?Dental advisory given ? ?Plan Discussed with: CRNA ? ?Anesthesia Plan Comments: (Lab Results ?     Component                Value               Date                 ?     WBC                      5.6                 06/19/2021           ?     HGB                      8.6 (L)             06/19/2021            ?     HCT                      24.5 (L)            06/19/2021           ?     MCV                      97.0                06/19/2021           ?     PLT                      91 (L)  06/19/2021          )  ? ? ? ? ? ? ?Anesthesia Quick Evaluation ? ?

## 2021-06-20 NOTE — Op Note (Signed)
[facility] ?Patient Name: Adam Ramos ?Procedure Date: 06/20/2021 ?MRN: [id number] ?Attending MD: Ronnette Juniper , MD ?Date of Birth: 05-19-1960 ?CSN: [id number] ?Age: 61 ?Admit Type: Inpatient ?Procedure:                Upper GI endoscopy ?Indications:              Melena, EGD showed duodenal ulcers, patient  ?                          continued to have melena and drop in Hb despite  ?                          blood transfusion, evaluate for SB bleed ?Providers:                Ronnette Juniper, MD, Benay Pillow, RN, Ladoris Gene,  ?                          RN, Designer, industrial/product, Technician ?Referring MD:             [facility] ?Medicines:                Monitored Anesthesia Care ?Complications:            No immediate complications. Estimated blood loss:  ?                          Minimal. ?Estimated Blood Loss:     Estimated blood loss was minimal. ?Procedure:                Pre-Anesthesia Assessment: ?                          - Prior to the procedure, a History and Physical  ?                          was performed, and patient medications and  ?                          allergies were reviewed. The patient's tolerance of  ?                          previous anesthesia was also reviewed. The risks  ?                          and benefits of the procedure and the sedation  ?                          options and risks were discussed with the patient.  ?                          All questions were answered, and informed consent  ?                          was obtained. Prior Anticoagulants: The patient has  ?  taken no previous anticoagulant or antiplatelet  ?                          agents. ASA Grade Assessment: III - A patient with  ?                          severe systemic disease. After reviewing the risks  ?                          and benefits, the patient was deemed in  ?                          satisfactory condition to undergo the procedure. ?                           After obtaining informed consent, the endoscope was  ?                          passed under direct vision. Throughout the  ?                          procedure, the patient's blood pressure, pulse, and  ?                          oxygen saturations were monitored continuously. The  ?                          [id number]-[id number] ([id number]) Olympus endoscope was introduced  ?                          through the mouth, and advanced to the second part  ?                          of duodenum. The upper GI endoscopy was  ?                          accomplished without difficulty. The patient  ?                          tolerated the procedure well. ?Scope In: ?Scope Out: ?Findings: ?     The upper third of the esophagus and middle third of the esophagus were  ?     normal. ?     One benign-appearing, intrinsic mild stenosis was found at the GE  ?     junction. The stenosis was traversed with an adult scope without much  ?     resistance. ?     Localized mildly erythematous mucosa without bleeding was found in the  ?     gastric antrum. ?     The cardia and gastric fundus were normal on retroflexion. ?     Few non-bleeding cratered and superficial duodenal ulcers with a clean  ?     ulcer base (Forrest Class III) were found in the duodenal bulb and in  ?     the first portion of the duodenum. The largest lesion was 4  mm in  ?     largest dimension. Using the endoscope, the video capsule enteroscope  ?     was advanced into the first portion of the duodenum. ?Impression:               - Normal upper third of esophagus and middle third  ?                          of esophagus. ?                          - Benign-appearing esophageal stenosis. ?                          - Erythematous mucosa in the antrum. ?                          - Non-bleeding duodenal ulcers with a clean ulcer  ?                          base (Forrest Class III). ?                          - Successful completion of the Video Capsule  ?                           Enteroscope placement. ?                          - No specimens collected. ?Moderate Sedation: ?     Patient did not receive moderate sedation for this procedure, but  ?     instead received monitored anesthesia care. ?Recommendation:           - Follow instructions as per pillcam protocol today. ?                          - Continue present medications. ?Procedure Code(s):        --- Professional --- ?                          [phone], Esophagogastroduodenoscopy, flexible,  ?                          transoral; diagnostic, including collection of  ?                          specimen(s) by brushing or washing, when performed  ?                          (separate procedure) ?Diagnosis Code(s):        --- Professional --- ?                          K22.2, Esophageal obstruction ?                          K31.89, Other diseases of stomach and duodenum ?  K26.9, Duodenal ulcer, unspecified as acute or  ?                          chronic, without hemorrhage or perforation ?                          K92.1, Melena (includes Hematochezia) ?CPT copyright 2019 [employer]. All rights reserved. ?The codes documented in this report are preliminary and upon coder review may  ?be revised to meet current compliance requirements. ?Ronnette Juniper, MD ?06/20/2021 12:39:03 PM ?This report has been signed electronically. ?Number of Addenda: 0 ?

## 2021-06-20 NOTE — Interval H&P Note (Signed)
History and Physical Interval Note: ?61/male with duodenal ulcers, melena and drop in Hb post transfusion, benign esophageal stricture at GE junction, for an EGD with possible balloon dilation and pillcam deployment with propofol. ? ?06/20/2021 ?12:08 PM ? ?Adam Ramos  has presented today for EGD with pillcam deployment, with the diagnosis of ongoing melena and anemia, s/p egd with duodenal ulcers.  The various methods of treatment have been discussed with the patient and family. After consideration of risks, benefits and other options for treatment, the patient has consented to  Procedure(s): ?ESOPHAGOGASTRODUODENOSCOPY (EGD) WITH PROPOFOL (N/A) ?GIVENS CAPSULE STUDY (N/A) as a surgical intervention.  The patient's history has been reviewed, patient examined, no change in status, stable for surgery.  I have reviewed the patient's chart and labs.  Questions were answered to the patient's satisfaction.   ? ? ?Ronnette Juniper ? ? ?

## 2021-06-20 NOTE — Progress Notes (Signed)
Left lower extremity venous duplex has been completed. ?Preliminary results can be found in CV Proc through chart review.  ? ?06/20/21 2:54 PM ?Adam Ramos RVT   ?

## 2021-06-20 NOTE — Progress Notes (Signed)
?PROGRESS NOTE ? ? ? ?Adam Ramos  BWG:[id number] DOB: [birth year] DOA: 06/17/2021 ?PCP: Alroy Dust, L.Marlou Sa, MD  ? ?Brief Narrative:  ?61 y.o. male with medical history significant of prostate cancer status post prostatectomy in 2021, GERD, diverticulosis, renal stones presented with black stools.  On presentation, hemoglobin was 6.5.  CT of abdomen and pelvis showed bilateral nonobstructing renal calculi, cholelithiasis and diverticulosis.  He was started on IV Protonix and 2 units of packed red cells were transfused.  GI was consulted.  He underwent EGD on 06/18/2021 ? ?Assessment & Plan: ?  ?Possible upper GI bleeding presenting with melena/Acute blood loss anemia ?-Presented with melena and hemoglobin of 6.5.  Status post 2 units packed red cells transfusion.  Monitor H&H.  Hemoglobin 7 today.  Will transfuse 1 unit packed red cells today.  IV Protonix has been switched to Protonix twice a day orally by GI.  ?-Status post EGD on 06/18/2021 which showed benign-appearing esophageal stenosis; acute duodenitis; nonbleeding duodenal ulcers with a clean ulcer base; biopsies pending.  GI recommends Protonix 40 mg twice daily for 2 months with no aspirin, ibuprofen, naproxen or other NSAIDs ?-Had black stools this morning again, capsule study per gi, repeat cbc in am ? ?Thrombocytopenia ?-Questionable cause.  Has had chronic thrombocytopenia.  ?Lower than baseline could be consumption from acute bleeding ? Repeat cbc in am ? ?Leukocytosis ?-Mild.  Resolved ? ?AKI with know renal stone ?Check ua, renal US ?Repeat bmp in am ? ?Recurrent UTI ?-Currently on trimethoprim.  Will DC since trimethoprim can cause anemia/thrombocytopenia ? ? ?Dyspnea ?- chest x-ray showed minimal left effusion ? ?Left lower extremity pitting edema ?Venous doppler ? ?GERD ?-Continue PPI ? ?History of prostate cancer status postresection in 2021 ?-Outpatient follow-up ? ? ?DVT prophylaxis: SCDs ?Code Status: Full ?Family Communication: Sister at bedside  on 06/20/2021 ?Disposition Plan: ? ?The patient will require care spanning > 2 midnights and should be moved to inpatient because: Need for another blood transfusion today.  H&H needs to be monitored. ? ?Consultants: GI ? ?Procedures:  ?ECG on 06/18/2021 ?Impression:               - Normal upper third of esophagus and middle third  ?                          of esophagus. ?                          - Benign-appearing esophageal stenosis.Traversed  ?                          with minimal resistance at the GE junction. ?                          - Normal stomach. ?                          - Acute duodenitis. ?                          - Non-bleeding duodenal ulcers with a clean ulcer  ?                          base (Forrest Class III). ?                          -  Normal second portion of the duodenum and third  ?                          portion of the duodenum. ?                          - Biopsies were taken with a cold forceps for  ?                          Helicobacter pylori testing. ?Moderate Sedation: ?     Patient did not receive moderate sedation for this procedure, but  ?     instead received monitored anesthesia care. ?Recommendation:           - Resume regular diet. ?                          - Continue present medications. ?                          - Await pathology results. ?                          - Use Protonix (pantoprazole) 40 mg PO BID for 2  ?                          months. ?                          - No aspirin, ibuprofen, naproxen, or other  ?                          non-steroidal anti-inflammatory drugs. ? ?Antimicrobials:  ?Anti-infectives (From admission, onward)  ? ? Start     Dose/Rate Route Frequency Ordered Stop  ? 06/17/21 2200  sulfamethoxazole-trimethoprim (BACTRIM DS) 800-160 MG per tablet 1 tablet  Status:  Discontinued       ? 1 tablet Oral 2 times daily 06/17/21 1913 06/17/21 2052  ? 06/17/21 2200  trimethoprim (TRIMPEX) tablet 100 mg  Status:  Discontinued       ? 100 mg  Oral Daily 06/17/21 2052 06/19/21 0858  ? ?  ? ? ?Subjective: ? ?Returned from EGD/capsule placement ?Reports stool is still dark, denies pain ?Cr elevated, reports intermittent back pain with know h/o renal stone, currently denies back pain, no n/v, no dysuria ?No rever ? Sister at bedside ?  ?Objective: ?Vitals:  ? 06/20/21 1240 06/20/21 1250 06/20/21 1300 06/20/21 1342  ?BP: (!) 89/45 (!) 100/51 (!) 102/57 110/73  ?Pulse:  67 67 63  ?Resp: (!) 21 (!) 21 (!) 23 16  ?Temp:    97.8 ?F (36.6 ?C)  ?TempSrc:    Oral  ?SpO2: 92% 92% 94% 99%  ?Weight:      ?Height:      ? ? ?Intake/Output Summary (Last 24 hours) at 06/20/2021 1438 ?Last data filed at 06/20/2021 0900 ?Gross per 24 hour  ?Intake 1673.94 ml  ?Output --  ?Net 1673.94 ml  ? ?Filed Weights  ? 06/19/21 0500 06/20/21 0500 06/20/21 1113  ?Weight: 108 kg 110.5 kg 107.5 kg  ? ? ?Examination: ? ?General: On room air.  No distress ?ENT/neck: No  thyromegaly.  JVD is not elevated  ?respiratory: Decreased breath sounds at bases bilaterally with some crackles; no wheezing CVS: S1-S2 heard, rate controlled currently ?Abdominal: Soft, nontender, slightly distended; no organomegaly, normal bowel sounds are heard ?Extremities: Trace lower extremity edema, left > right ; no cyanosis  ?CNS: Awake and alert.  No focal neurologic deficit.  Moves extremities ?Lymph: No obvious lymphadenopathy ?Skin: chronic venous stasis skin pigmentation ?psych: Flat affect.  No agitation currently.   ?Musculoskeletal: No obvious joint swelling/deformity ? ? ? ?Data Reviewed: I have personally reviewed following labs and imaging studies ? ?CBC: ?Recent Labs  ?Lab 06/17/21 ?1532 06/18/21 ?1004 06/19/21 ?0419 06/19/21 ?1520  ?WBC 11.0* 7.2 5.6  --   ?NEUTROABS 8.0*  --  3.6  --   ?HGB 6.5* 7.5* 7.0* 8.6*  ?HCT 17.9* 20.8* 19.6* 24.5*  ?MCV 100.6* 95.9 97.0  --   ?PLT 113* 109* 91*  --   ? ?Basic Metabolic Panel: ?Recent Labs  ?Lab 06/17/21 ?1532 06/18/21 ?1004 06/19/21 ?0419  ?NA 136 140 140  ?K 3.6  3.6 3.7  ?CL 107 113* 112*  ?CO2 '22 22 24  '$ ?GLUCOSE 133* 103* 103*  ?BUN 33* 22 24*  ?CREATININE 1.26* 1.26* 1.61*  ?CALCIUM 9.1 8.3* 8.2*  ?MG  --   --  2.2  ? ?GFR: ?Estimated Creatinine Clearance: 59.5 mL/min (A) (by C-G formula based on SCr of 1.61 mg/dL (H)). ?Liver Function Tests: ?Recent Labs  ?Lab 06/17/21 ?1532  ?AST 21  ?ALT 20  ?ALKPHOS 54  ?BILITOT 0.6  ?PROT 5.5*  ?ALBUMIN 3.5  ? ?Recent Labs  ?Lab 06/17/21 ?1532  ?LIPASE 57*  ? ?No results for input(s): AMMONIA in the last 168 hours. ?Coagulation Profile: ?No results for input(s): INR, PROTIME in the last 168 hours. ?Cardiac Enzymes: ?No results for input(s): CKTOTAL, CKMB, CKMBINDEX, TROPONINI in the last 168 hours. ?BNP (last 3 results) ?No results for input(s): PROBNP in the last 8760 hours. ?HbA1C: ?No results for input(s): HGBA1C in the last 72 hours. ?CBG: ?No results for input(s): GLUCAP in the last 168 hours. ?Lipid Profile: ?No results for input(s): CHOL, HDL, LDLCALC, TRIG, CHOLHDL, LDLDIRECT in the last 72 hours. ?Thyroid Function Tests: ?No results for input(s): TSH, T4TOTAL, FREET4, T3FREE, THYROIDAB in the last 72 hours. ?Anemia Panel: ?Recent Labs  ?  06/17/21 ?1532 06/17/21 ?2029  ?FERRITIN  --  55  ?TIBC  --  311  ?IRON  --  101  ?RETICCTPCT 8.9*  --   ? ?Sepsis Labs: ?No results for input(s): PROCALCITON, LATICACIDVEN in the last 168 hours. ? ?Recent Results (from the past 240 hour(s))  ?Urine Culture     Status: None  ? Collection Time: 06/17/21  3:46 PM  ? Specimen: Urine, Clean Catch  ?Result Value Ref Range Status  ? Specimen Description   Final  ?  URINE, CLEAN CATCH ?Performed at [facility], [place] [address] [place], [place] [address] ?  ? Special Requests   Final  ?  NONE ?Performed at [facility], [place] [address], [place], [place] [address] ?  ? Culture   Final  ?  NO GROWTH ?Performed at [facility], [place] [address] [place], [place] [address] ?  ? Report Status 06/19/2021 FINAL   Final  ?  ? ? ? ? ? ?Radiology Studies: ?DG Chest 2 View ? ?Result Date: 06/19/2021 ?CLINICAL DATA:  Chest tightness EXAM: CHEST - 2 VIEW COMPARISON:  None Available. FINDINGS: Cardiac and mediastinal  co

## 2021-06-21 ENCOUNTER — Other Ambulatory Visit (HOSPITAL_COMMUNITY): Payer: Self-pay | Admitting: Gastroenterology

## 2021-06-21 ENCOUNTER — Encounter (HOSPITAL_COMMUNITY): Payer: Self-pay | Admitting: Gastroenterology

## 2021-06-21 ENCOUNTER — Telehealth: Payer: Self-pay | Admitting: Hematology and Oncology

## 2021-06-21 ENCOUNTER — Other Ambulatory Visit: Payer: Self-pay | Admitting: Gastroenterology

## 2021-06-21 DIAGNOSIS — T189XXA Foreign body of alimentary tract, part unspecified, initial encounter: Secondary | ICD-10-CM

## 2021-06-21 DIAGNOSIS — N200 Calculus of kidney: Secondary | ICD-10-CM

## 2021-06-21 DIAGNOSIS — K5730 Diverticulosis of large intestine without perforation or abscess without bleeding: Secondary | ICD-10-CM

## 2021-06-21 LAB — BASIC METABOLIC PANEL
Anion gap: 6 (ref 5–15)
BUN: 16 mg/dL (ref 8–23)
CO2: 22 mmol/L (ref 22–32)
Calcium: 8.3 mg/dL — ABNORMAL LOW (ref 8.9–10.3)
Chloride: 112 mmol/L — ABNORMAL HIGH (ref 98–111)
Creatinine, Ser: 1.23 mg/dL (ref 0.61–1.24)
GFR, Estimated: >60 mL/min (ref >60)
Glucose, Bld: 103 mg/dL — ABNORMAL HIGH (ref 70–99)
Potassium: 3.8 mmol/L (ref 3.5–5.1)
Sodium: 140 mmol/L (ref 135–145)

## 2021-06-21 LAB — CBC
HCT: 24.9 % — ABNORMAL LOW (ref 39.0–52.0)
Hemoglobin: 8.6 g/dL — ABNORMAL LOW (ref 13.0–17.0)
MCH: 33.7 pg (ref 26.0–34.0)
MCHC: 34.5 g/dL (ref 30.0–36.0)
MCV: 97.6 fL (ref 80.0–100.0)
Platelets: 96 10*3/uL — ABNORMAL LOW (ref 150–400)
RBC: 2.55 MIL/uL — ABNORMAL LOW (ref 4.22–5.81)
RDW: 21 % — ABNORMAL HIGH (ref 11.5–15.5)
WBC: 5.1 10*3/uL (ref 4.0–10.5)
nRBC: 0.4 % — ABNORMAL HIGH (ref 0.0–0.2)

## 2021-06-21 MED ORDER — PANTOPRAZOLE SODIUM 40 MG PO TBEC: 40.0000 mg | DELAYED_RELEASE_TABLET | Freq: Every day | ORAL | Status: DC

## 2021-06-21 MED ORDER — PANTOPRAZOLE SODIUM 40 MG PO TBEC: 40.0000 mg | DELAYED_RELEASE_TABLET | Freq: Two times a day (BID) | ORAL | 0 refills | Status: DC

## 2021-06-21 MED ORDER — FUROSEMIDE 10 MG/ML IJ SOLN
20.0000 mg | Freq: Once | INTRAMUSCULAR | Status: AC
  Administered 2021-06-21: 20 mg via INTRAVENOUS
  Filled 2021-06-21: qty 2

## 2021-06-21 MED ORDER — TRIMETHOPRIM 100 MG PO TABS: 100.0000 mg | ORAL_TABLET | Freq: Every day | ORAL | Status: DC

## 2021-06-21 NOTE — Plan of Care (Signed)
?  Problem: Education: ?Goal: Ability to identify signs and symptoms of gastrointestinal bleeding will improve ?Outcome: Adequate for Discharge ?  ?Problem: Bowel/Gastric: ?Goal: Will show no signs and symptoms of gastrointestinal bleeding ?Outcome: Adequate for Discharge ?  ?Problem: Fluid Volume: ?Goal: Will show no signs and symptoms of excessive bleeding ?Outcome: Adequate for Discharge ?  ?Problem: Clinical Measurements: ?Goal: Complications related to the disease process, condition or treatment will be avoided or minimized ?Outcome: Adequate for Discharge ?  ?Problem: Education: ?Goal: Knowledge of General Education information will improve ?Description: Including pain rating scale, medication(s)/side effects and non-pharmacologic comfort measures ?Outcome: Adequate for Discharge ?  ?Problem: Health Behavior/Discharge Planning: ?Goal: Ability to manage health-related needs will improve ?Outcome: Adequate for Discharge ?  ?Problem: Clinical Measurements: ?Goal: Ability to maintain clinical measurements within normal limits will improve ?Outcome: Adequate for Discharge ?Goal: Will remain free from infection ?Outcome: Adequate for Discharge ?Goal: Diagnostic test results will improve ?Outcome: Adequate for Discharge ?Goal: Respiratory complications will improve ?Outcome: Adequate for Discharge ?Goal: Cardiovascular complication will be avoided ?Outcome: Adequate for Discharge ?  ?Problem: Activity: ?Goal: Risk for activity intolerance will decrease ?Outcome: Adequate for Discharge ?  ?Problem: Nutrition: ?Goal: Adequate nutrition will be maintained ?Outcome: Adequate for Discharge ?  ?Problem: Coping: ?Goal: Level of anxiety will decrease ?Outcome: Adequate for Discharge ?  ?Problem: Elimination: ?Goal: Will not experience complications related to bowel motility ?Outcome: Adequate for Discharge ?Goal: Will not experience complications related to urinary retention ?Outcome: Adequate for Discharge ?  ?Problem: Pain  Managment: ?Goal: General experience of comfort will improve ?Outcome: Adequate for Discharge ?  ?Problem: Safety: ?Goal: Ability to remain free from injury will improve ?Outcome: Adequate for Discharge ?  ?Problem: Skin Integrity: ?Goal: Risk for impaired skin integrity will decrease ?Outcome: Adequate for Discharge ? ?Pt dc home ?  ?

## 2021-06-21 NOTE — Discharge Summary (Signed)
?Discharge Summary ? ?Adam Ramos KGY:[id number] DOB: 11/03/1960 ? ?PCP: Collene Leyden, MD ? ?Admit date: 06/17/2021 ?Discharge date: 06/21/2021 ? ?Time spent: 33mns, more than 50% time spent on coordination of care.  ? ?Recommendations for Outpatient Follow-up:  ?F/u with [facility] within a week  for hospital discharge follow up, repeat cbc/bmp at follow up ?Follow-up with [facility] for capsule study result ?F/u with hematology as already scheduled for anemia and thrombocytopenia ?Follow-up with urology for bilateral kidney stones in 2 weeks as scheduled ? ? ?Discharge Diagnoses:  ?Active Hospital Problems  ? Diagnosis Date Noted  ? GI bleed 06/17/2021  ? Diverticular disease of colon 06/17/2021  ? Recurrent UTI 06/17/2021  ? Leukocytosis 06/17/2021  ? Prostate cancer ([place] 05/17/2019  ?  ?Resolved Hospital Problems  ?No resolved problems to display.  ? ? ?Discharge Condition: stable ? ?Diet recommendation: Regular diet ? ?Filed Weights  ? 06/19/21 0500 06/20/21 0500 06/20/21 1113  ?Weight: 108 kg 110.5 kg 107.5 kg  ? ? ?History of present illness: ( per admitting MD Dr MTrilby Drummer ?Patient coming from: Home ?  ?Chief Complaint: Rectal bleeding ?  ?HPI: Adam C JMartiniqueis a 61y.o. male with medical history significant of prostate cancer status post prostatectomy in 2021, GERD, diverticulosis, renal stones presenting with several days of black stools. ? ?As above patient reports several days of black stools with labile blood pressure.  In addition he is reporting some pain in his lower abdomen and some intermittent chest pain that seems to be worse with exertion.  Also experiencing some weakness.  He states he had some concern that he may be having a recurrent UTI he is currently on suppressive Bactrim. ? ?He did go see his PCP 2 days ago with these concerns and noted to have positive FOBT at that time.  [facility]  He also reports taking some meloxicam for couple weeks last  month for urologic procedure. ? ?He denies fevers, chills, shortness of breath, constipation, nausea, vomiting. ?  ?ED Course: Vital signs in the ED stable.  Lab work-up included CMP with BUN 33, creatinine stable at 1.26, glucose 133, protein 5.5.  CBC with hemoglobin of 6.5 with a baseline of normal, platelets 113, leukocytosis to 11.  Troponin normal with repeat pending.  Lipase mildly elevated at 57.  Urinalysis showing small leukocytes only.  Urine culture pending.  Patient typed and screened in the ED.  CT abdomen pelvis showing bilateral nonobstructing renal calculi, cholelithiasis and diverticulosis.  Patient received IV PPI and 2 units of blood of been ordered.  GI consulted and will see the patient the morning. ? ?Hospital Course:  ?Principal Problem: ?  GI bleed ?Active Problems: ?  [place]state cancer ([place] ?  Diverticular disease of colon ?  Recurrent UTI ?  Leukocytosis ? ? ?Assessment and Plan: ? ?Possible upper GI bleeding presenting with melena/Acute blood loss anemia ?-Presented with melena and hemoglobin of 6.5.   ?-s/p prbc x3 , hgb stable for the last two days at 8.6 ?-ppi bid per GI.  ?-Status post EGD on 06/18/2021 which showed benign-appearing esophageal stenosis; acute duodenitis; nonbleeding duodenal ulcers with a clean ulcer base; biopsies pending.   ?-GI recommends Protonix 40 mg twice daily for 2 months with no aspirin, ibuprofen, naproxen or other NSAIDs ?-capsule study on 5/10, f/u with [facility] for result and intructions on protonix duration  ? ?  ?Thrombocytopenia ?-Questionable cause.  Has had chronic  thrombocytopenia.  ?-Lower than baseline could be consumption from acute bleeding ?-hold trimethoprim for now, as can cause anemia/thrombocytopenia, patient to f/u with hematology  ? ?Leukocytosis ?-Resolved ?  ?AKI with know renal stone ?Ua unremarkable ? renal US confirmed bilateral kidney stones, no hydronephrosis ?He received hydration, Cr normalized at discharge ?  ?Recurrent UTI ?-no  sign of uti during this hospitalization ?-Currently on trimethoprim.  Will hold since trimethoprim can cause anemia/thrombocytopenia ?F/u with hematology and urology  ?  ? ?Dyspnea/Left lower extremity pitting edema from volume overload ?- chest x-ray showed minimal left effusion ?-Venous doppler negative for DVT ?-received one dose lasix prior to discharge  ?  ?GERD ?-Continue PPI ? ?History of prostate cancer status postresection in 2021 ?-Outpatient follow-up ?  ? ?Discharge Exam: ?BP 131/81 (BP Location: Left Arm)   Pulse 66   Temp 97.6 ?F (36.4 ?C) (Oral)   Resp 18   Ht 6' (1.829 m)   Wt 107.5 kg   SpO2 99%   BMI 32.14 kg/m?  ? ?General: NAD ?Cardiovascular: RRR ?Respiratory: CATBL ? ? ? ?Discharge Instructions   ? ? Ambulatory referral to Hematology / Oncology   Complete by: As directed ?  ? Diet general   Complete by: As directed ?  ? Increase activity slowly   Complete by: As directed ?  ? ?  ? ?Allergies as of 06/21/2021   ?No Known Allergies ?  ? ?  ?Medication List  ?  ? ?STOP taking these medications   ? ?phenazopyridine 200 MG tablet ?Commonly known as: Pyridium ?  ?traMADol 50 MG tablet ?Commonly known as: Ultram ?  ? ?  ? ?TAKE these medications   ? ?acetaminophen 500 MG tablet ?Commonly known as: TYLENOL ?Take 500 mg by mouth every 6 (six) hours as needed for moderate pain. ?  ?docusate sodium 100 MG capsule ?Commonly known as: COLACE ?Take 100 mg by mouth daily as needed for mild constipation. ?  ?pantoprazole 40 MG tablet ?Commonly known as: Protonix ?Take 1 tablet (40 mg total) by mouth 2 (two) times daily. ?  ?trimethoprim 100 MG tablet ?Commonly known as: TRIMPEX ?Take 1 tablet (100 mg total) by mouth daily. Please hold this medication, until you discuss with hematology and urology ?What changed: additional instructions ?  ? ?  ? ?No Known Allergies ? Follow-up Information   ? ? [employer] Follow up.   ?Contact information: ?Please call number on the back of you insurance card for a PCP in  Network with your insurance. ? ?  ?  ? ? [facility] Follow up.   ?Contact information: ?[address] ?[address] ?[place] [address] ?[phone] ? ? ?  ?  ? ? f/u with hematology Follow up.   ?Why: for anemia and thrombocytopnea ? ?  ?  ? ? Ardis Hughs, MD Follow up.   ?Specialty: Urology ?Why: for bilateral kidneys stones ?Contact information: ?[address] ?[place] [address] ?[phone] ? ? ?  ?  ? ? Collene Leyden, MD Follow up on 06/25/2021.   ?Specialty: Family Medicine ?Why: hospital discharge follow up, repeat basic labs including cbc/bmp ?pcp to monitor kidney function ?Contact information: ?[address] ?[place] [address] ?[phone] ? ? ?  ?  ? ?  ?  ? ?  ? ? ? ?The results of significant diagnostics from this hospitalization (including imaging, microbiology, ancillary and laboratory) are listed below for reference.   ? ?Significant Diagnostic Studies: ?CT  Abdomen Pelvis Wo Contrast ? ?Result Date: 06/17/2021 ?CLINICAL DATA:  Abdominal pain. EXAM: CT ABDOMEN AND PELVIS WITHOUT CONTRAST TECHNIQUE: Multidetector CT imaging of the abdomen and pelvis was performed following the standard protocol without IV contrast. RADIATION DOSE REDUCTION: This exam was performed according to the departmental dose-optimization program which includes automated exposure control, adjustment of the mA and/or kV according to patient size and/or use of iterative reconstruction technique. COMPARISON:  March 28, 2020 and April 19, 2011 FINDINGS: Lower chest: No acute abnormality. Hepatobiliary: No focal liver abnormality is seen. A small layer of tiny gallstones is seen within the dependent portion of an otherwise normal-appearing gallbladder. There is no evidence of biliary dilatation. Pancreas: Unremarkable. No pancreatic ductal dilatation or surrounding inflammatory changes. Spleen: Normal in size without focal abnormality. Adrenals/Urinary Tract: Adrenal glands are unremarkable.  Kidneys are normal in size, without obstructing renal calculi, focal lesion, or hydronephrosis. Mild prominence of an extrarenal pelvis is seen on the left. A 2 mm nonobstructing renal calculus is seen within the mid ri

## 2021-06-21 NOTE — Telephone Encounter (Signed)
Scheduled appt per 5/9 referral. Pt is aware of appt date and time. Pt is aware to arrive 15 mins prior to appt time and to bring and updated insurance card. Pt is aware of appt location.   ?

## 2021-06-21 NOTE — Progress Notes (Signed)
Pt had an uneventful night. Denies dark/bloody stool.  ?Pt reports taking off GI monitor this AM per MD instruction post procedure  ?

## 2021-06-22 NOTE — Anesthesia Postprocedure Evaluation (Signed)
Anesthesia Post Note ? ?Patient: Adam Ramos ? ?Procedure(s) Performed: ESOPHAGOGASTRODUODENOSCOPY (EGD) WITH PROPOFOL ?GIVENS CAPSULE STUDY ? ?  ? ?Patient location during evaluation: Endoscopy ?Anesthesia Type: MAC ?Level of consciousness: awake and alert ?Pain management: pain level controlled ?Vital Signs Assessment: post-procedure vital signs reviewed and stable ?Respiratory status: spontaneous breathing, nonlabored ventilation, respiratory function stable and patient connected to nasal cannula oxygen ?Cardiovascular status: blood pressure returned to baseline and stable ?Postop Assessment: no apparent nausea or vomiting ?Anesthetic complications: no ? ? ?No notable events documented. ? ?Last Vitals:  ?Vitals:  ? 06/21/21 0604 06/21/21 0929  ?BP: 129/73 131/81  ?Pulse: 64 66  ?Resp: 20 18  ?Temp: 36.6 ?C 36.4 ?C  ?SpO2: 99% 99%  ?  ?Last Pain:  ?Vitals:  ? 06/21/21 0929  ?TempSrc: Oral  ?PainSc:   ? ? ?  ?  ?  ?  ?  ?  ? ?March Rummage [name] ? ? ? ? ?

## 2021-06-25 DIAGNOSIS — D649 Anemia, unspecified: Secondary | ICD-10-CM | POA: Diagnosis not present

## 2021-06-25 DIAGNOSIS — N289 Disorder of kidney and ureter, unspecified: Secondary | ICD-10-CM | POA: Diagnosis not present

## 2021-06-25 DIAGNOSIS — R609 Edema, unspecified: Secondary | ICD-10-CM | POA: Diagnosis not present

## 2021-06-25 DIAGNOSIS — K922 Gastrointestinal hemorrhage, unspecified: Secondary | ICD-10-CM | POA: Diagnosis not present

## 2021-06-26 ENCOUNTER — Telehealth: Payer: Self-pay | Admitting: Hematology and Oncology

## 2021-06-26 ENCOUNTER — Ambulatory Visit (HOSPITAL_COMMUNITY)
Admission: RE | Admit: 2021-06-26 | Discharge: 2021-06-26 | Disposition: A | Discharge status: Home / Self Care | Payer: BC Managed Care – PPO | Source: Ambulatory Visit | Attending: Gastroenterology | Admitting: Gastroenterology

## 2021-06-26 DIAGNOSIS — T189XXA Foreign body of alimentary tract, part unspecified, initial encounter: Secondary | ICD-10-CM | POA: Diagnosis not present

## 2021-06-26 DIAGNOSIS — K3189 Other diseases of stomach and duodenum: Secondary | ICD-10-CM | POA: Diagnosis not present

## 2021-06-26 DIAGNOSIS — K314 Gastric diverticulum: Secondary | ICD-10-CM | POA: Diagnosis not present

## 2021-06-26 DIAGNOSIS — K5730 Diverticulosis of large intestine without perforation or abscess without bleeding: Secondary | ICD-10-CM | POA: Diagnosis not present

## 2021-06-26 DIAGNOSIS — N200 Calculus of kidney: Secondary | ICD-10-CM | POA: Diagnosis not present

## 2021-06-26 MED ORDER — BARIUM SULFATE 0.1 % PO SUSP
ORAL | Status: AC
  Filled 2021-06-26: qty 3

## 2021-06-26 MED ORDER — SODIUM CHLORIDE (PF) 0.9 % IJ SOLN
INTRAMUSCULAR | Status: AC
  Filled 2021-06-26: qty 50

## 2021-06-26 MED ORDER — IOHEXOL 300 MG/ML  SOLN
100.0000 mL | Freq: Once | INTRAMUSCULAR | Status: AC | PRN
  Administered 2021-06-26: 100 mL via INTRAVENOUS

## 2021-06-26 NOTE — Telephone Encounter (Signed)
R/s pt's new hem appt. Pt is aware of new appt date and time.  °

## 2021-06-27 DIAGNOSIS — N3946 Mixed incontinence: Secondary | ICD-10-CM | POA: Diagnosis not present

## 2021-06-27 DIAGNOSIS — R8271 Bacteriuria: Secondary | ICD-10-CM | POA: Diagnosis not present

## 2021-06-27 DIAGNOSIS — R350 Frequency of micturition: Secondary | ICD-10-CM | POA: Diagnosis not present

## 2021-06-28 DIAGNOSIS — Z8546 Personal history of malignant neoplasm of prostate: Secondary | ICD-10-CM | POA: Diagnosis not present

## 2021-06-28 DIAGNOSIS — N3946 Mixed incontinence: Secondary | ICD-10-CM | POA: Diagnosis not present

## 2021-07-04 ENCOUNTER — Other Ambulatory Visit: Payer: BC Managed Care – PPO

## 2021-07-04 ENCOUNTER — Encounter: Payer: BC Managed Care – PPO | Admitting: Hematology and Oncology

## 2021-07-05 ENCOUNTER — Inpatient Hospital Stay: Payer: BC Managed Care – PPO

## 2021-07-05 ENCOUNTER — Inpatient Hospital Stay: Payer: BC Managed Care – PPO | Attending: Hematology and Oncology | Admitting: Hematology and Oncology

## 2021-07-05 ENCOUNTER — Encounter: Payer: Self-pay | Admitting: Hematology and Oncology

## 2021-07-05 ENCOUNTER — Other Ambulatory Visit: Payer: Self-pay

## 2021-07-05 VITALS — BP 131/83 | HR 50 | Temp 97.7°F | Resp 16 | Ht 72.0 in | Wt 231.3 lb

## 2021-07-05 DIAGNOSIS — D649 Anemia, unspecified: Secondary | ICD-10-CM

## 2021-07-05 DIAGNOSIS — Z87891 Personal history of nicotine dependence: Secondary | ICD-10-CM | POA: Diagnosis not present

## 2021-07-05 DIAGNOSIS — K922 Gastrointestinal hemorrhage, unspecified: Secondary | ICD-10-CM | POA: Diagnosis not present

## 2021-07-05 DIAGNOSIS — Z79899 Other long term (current) drug therapy: Secondary | ICD-10-CM | POA: Diagnosis not present

## 2021-07-05 DIAGNOSIS — Z8546 Personal history of malignant neoplasm of prostate: Secondary | ICD-10-CM | POA: Diagnosis not present

## 2021-07-05 DIAGNOSIS — Z9079 Acquired absence of other genital organ(s): Secondary | ICD-10-CM | POA: Diagnosis not present

## 2021-07-05 DIAGNOSIS — D696 Thrombocytopenia, unspecified: Secondary | ICD-10-CM | POA: Diagnosis not present

## 2021-07-05 DIAGNOSIS — M7989 Other specified soft tissue disorders: Secondary | ICD-10-CM | POA: Diagnosis not present

## 2021-07-05 LAB — COMPREHENSIVE METABOLIC PANEL
ALT: 16 U/L (ref 0–44)
AST: 18 U/L (ref 15–41)
Albumin: 4.4 g/dL (ref 3.5–5.0)
Alkaline Phosphatase: 81 U/L (ref 38–126)
Anion gap: 6 (ref 5–15)
BUN: 16 mg/dL (ref 8–23)
CO2: 27 mmol/L (ref 22–32)
Calcium: 9.5 mg/dL (ref 8.9–10.3)
Chloride: 107 mmol/L (ref 98–111)
Creatinine, Ser: 1.26 mg/dL — ABNORMAL HIGH (ref 0.61–1.24)
GFR, Estimated: >60 mL/min (ref >60)
Glucose, Bld: 94 mg/dL (ref 70–99)
Potassium: 3.9 mmol/L (ref 3.5–5.1)
Sodium: 140 mmol/L (ref 135–145)
Total Bilirubin: 0.7 mg/dL (ref 0.3–1.2)
Total Protein: 6.8 g/dL (ref 6.5–8.1)

## 2021-07-05 LAB — APTT: aPTT: 31 seconds (ref 24–36)

## 2021-07-05 LAB — CBC WITH DIFFERENTIAL/PLATELET
Abs Immature Granulocytes: 0.01 10*3/uL (ref 0.00–0.07)
Basophils Absolute: 0 10*3/uL (ref 0.0–0.1)
Basophils Relative: 0 %
Eosinophils Absolute: 0.2 10*3/uL (ref 0.0–0.5)
Eosinophils Relative: 5 %
HCT: 34.8 % — ABNORMAL LOW (ref 39.0–52.0)
Hemoglobin: 11.9 g/dL — ABNORMAL LOW (ref 13.0–17.0)
Immature Granulocytes: 0 %
Lymphocytes Relative: 21 %
Lymphs Abs: 0.9 10*3/uL (ref 0.7–4.0)
MCH: 32.2 pg (ref 26.0–34.0)
MCHC: 34.2 g/dL (ref 30.0–36.0)
MCV: 94.3 fL (ref 80.0–100.0)
Monocytes Absolute: 0.3 10*3/uL (ref 0.1–1.0)
Monocytes Relative: 7 %
Neutro Abs: 3 10*3/uL (ref 1.7–7.7)
Neutrophils Relative %: 67 %
Platelets: 119 10*3/uL — ABNORMAL LOW (ref 150–400)
RBC: 3.69 MIL/uL — ABNORMAL LOW (ref 4.22–5.81)
RDW: 16.1 % — ABNORMAL HIGH (ref 11.5–15.5)
WBC: 4.5 10*3/uL (ref 4.0–10.5)
nRBC: 0 % (ref 0.0–0.2)

## 2021-07-05 LAB — PROTIME-INR
INR: 1.2 (ref 0.8–1.2)
Prothrombin Time: 14.9 seconds (ref 11.4–15.2)

## 2021-07-05 LAB — IRON AND IRON BINDING CAPACITY (CC-WL,HP ONLY)
Iron: 61 ug/dL (ref 45–182)
Saturation Ratios: 15 % — ABNORMAL LOW (ref 17.9–39.5)
TIBC: 421 ug/dL (ref 250–450)
UIBC: 360 ug/dL (ref 117–376)

## 2021-07-05 LAB — VITAMIN B12: Vitamin B-12: 215 pg/mL (ref 180–914)

## 2021-07-05 LAB — LACTATE DEHYDROGENASE: LDH: 121 U/L (ref 98–192)

## 2021-07-05 NOTE — Progress Notes (Signed)
[place] CONSULT NOTE  Patient Care Team: Collene Leyden, MD as PCP - General (Family Medicine)  CHIEF COMPLAINTS/PURPOSE OF CONSULTATION:  Anemia and thrombocytopenia  ASSESSMENT & PLAN:   This is a very pleasant 61 year old male patient with past medical history significant for kidney stones and recent GI bleed referred to hematology for evaluation of thrombocytopenia.  Patient is completely asymptomatic except for recent episode of black stools.  He cannot recall having to see any doctor for thrombocytopenia.  He had prostate cancer about 2 years ago and had radical prostatectomy.  He denies any ongoing bleeding issues.  No known autoimmune diseases or alcohol use or liver diseases.  Rest of the pertinent review of systems unremarkable.  Physical examination today without any concerns except for some left lower extremity swelling, negative vascular  Thrombocytopenia is defined as a platelet count below the lower limit of normal (ie, <150,000/microL [150 x 109/L] for adults).  Degrees of thrombocytopenia can be further subdivided into mild (platelet count 100,000 to 150,000/microL), moderate (50,000 to 99,000/microL), and severe (<50,000/microL) Most common causes of thrombocytopenia include but not limited to chronic liver disease or hypersplenism, immune thrombocytopenia, viral infections such as Hepatitis, HIV, active bacterial infections, autoimmune diseases, alcohol, nutritional deficiencies and medications. Rarely bone marrow disorders such as myelodysplatic syndrome, bone marrow failure syndromes, acute leukemia and PNH can present with thrombocytopenia. Additional rare causes of thrombocytopenia include vascular conditions associated with platelet destruction (eg, giant capillary hemangioma, large aortic aneurysms, cardiopulmonary bypass, intraaortic balloon pumps.  We agreed to some blood work today to rule out common causes of thrombocytopenia.  If he continues to have  progressive thrombocytopenia and if the above-mentioned labs are unremarkable, we can certainly consider bone marrow aspiration biopsy.  He is agreeable to all recommendations.  He will return to clinic for telephone visit to review lab results and to discuss any additional recommendations.  HISTORY OF PRESENTING ILLNESS:  Adam Ramos 61 y.o. male is here because of anemia and thrombocytopenia.  He arrived to the appointment with his sister who is a Equities trader.  He denies any new health complaints today except for recent GI bleed.  He was taking some meloxicam prior to the GI bleed for almost 2 weeks and that he had an endoscopy which showed some ulcers in the stomach and the duodenum. He does not drink any alcohol.  No known liver diseases.  No known autoimmune disease.  He had radical prostatectomy for prostate cancer and lymph node dissection, otherwise no adjuvant Lupron or brachytherapy. No ongoing bleeding issues.  Rest of the pertinent 10 point ROS reviewed and negative  REVIEW OF SYSTEMS:   Constitutional: Denies fevers, chills or abnormal night sweats Eyes: Denies blurriness of vision, double vision or watery eyes Ears, nose, mouth, throat, and face: Denies mucositis or sore throat Respiratory: Denies cough, dyspnea or wheezes Cardiovascular: Denies palpitation, chest discomfort or lower extremity swelling Gastrointestinal:  Denies nausea, heartburn or change in bowel habits Skin: Denies abnormal skin rashes Lymphatics: Denies new lymphadenopathy or easy bruising Neurological:Denies numbness, tingling or new weaknesses Behavioral/Psych: Mood is stable, no new changes  All other systems were reviewed with the patient and are negative.  MEDICAL HISTORY:  Past Medical History:  Diagnosis Date   GERD (gastroesophageal reflux disease)    History of kidney stones    Nocturia    Prostate cancer [facility]) urologist--- dr Louis Meckel   s/p prostatectomy 05-17-2019   Urinary  incontinence    Wears contact lenses  SURGICAL HISTORY: Past Surgical History:  Procedure Laterality Date   BIOPSY  06/18/2021   Procedure: BIOPSY;  Surgeon: Ronnette Juniper, MD;  Location: [facility];  Service: Gastroenterology;;   CYSTOSCOPY/URETEROSCOPY/HOLMIUM LASER/STENT PLACEMENT Left 02/19/2018   Procedure: LEFT URETEROSCOPY/HOLMIUM LASER/ STONE REMOVAL  STENT PLACEMENT;  Surgeon: Ardis Hughs, MD;  Location: [facility];  Service: Urology;  Laterality: Left;   ESOPHAGOGASTRODUODENOSCOPY (EGD) WITH PROPOFOL N/A 06/18/2021   Procedure: ESOPHAGOGASTRODUODENOSCOPY (EGD) WITH PROPOFOL;  Surgeon: Ronnette Juniper, MD;  Location: [facility];  Service: Gastroenterology;  Laterality: N/A;   ESOPHAGOGASTRODUODENOSCOPY (EGD) WITH PROPOFOL N/A 06/20/2021   Procedure: ESOPHAGOGASTRODUODENOSCOPY (EGD) WITH PROPOFOL;  Surgeon: Ronnette Juniper, MD;  Location: [facility];  Service: Gastroenterology;  Laterality: N/A;   EXTRACORPOREAL SHOCK WAVE LITHOTRIPSY Right 09/02/2016   Procedure: RIGHT EXTRACORPOREAL SHOCK WAVE LITHOTRIPSY (ESWL);  Surgeon: Nickie Retort, MD;  Location: [facility];  Service: Urology;  Laterality: Right;   EXTRACORPOREAL SHOCK WAVE LITHOTRIPSY  02-17-2014;  05-20-2011  '@[facility]'$    FOREIGN BODY REMOVAL N/A 09/02/2019   Procedure: CYSTOSCOPY FOREIGN BODY REMOVAL ADULT;  Surgeon: Ardis Hughs, MD;  Location: [facility];  Service: Urology;  Laterality: N/A;   GIVENS CAPSULE STUDY N/A 06/20/2021   Procedure: GIVENS CAPSULE STUDY;  Surgeon: Ronnette Juniper, MD;  Location: [facility];  Service: Gastroenterology;  Laterality: N/A;   HOLMIUM LASER APPLICATION Left 04/15/7423   Procedure: HOLMIUM LASER APPLICATION;  Surgeon: Ardis Hughs, MD;  Location: [facility];  Service: Urology;  Laterality: Left;   PELVIC LYMPH NODE DISSECTION Bilateral 05/17/2019   Procedure: PELVIC LYMPH NODE DISSECTION;  Surgeon: Ardis Hughs, MD;  Location: [facility];  Service: Urology;  Laterality: Bilateral;   ROBOT ASSISTED LAPAROSCOPIC RADICAL PROSTATECTOMY N/A 05/17/2019   Procedure: XI ROBOTIC ASSISTED LAPAROSCOPIC RADICAL PROSTATECTOMY;  Surgeon: Ardis Hughs, MD;  Location: [facility];  Service: Urology;  Laterality: N/A;   SHOULDER ARTHROSCOPY Right early 2000s   removal spurs    SOCIAL HISTORY: Social History   Socioeconomic History   Marital status: Single    Spouse name: Not on file   Number of children: Not on file   Years of education: Not on file   Highest education level: Not on file  Occupational History   Not on file  Tobacco Use   Smoking status: Former    Years: 4.00    Types: Cigarettes    Quit date: 02/12/1983    Years since quitting: 38.4   Smokeless tobacco: Never  Vaping Use   Vaping Use: Never used  Substance and Sexual Activity   Alcohol use: Yes    Comment: occassional    Drug use: No   Sexual activity: Not on file  Other Topics Concern   Not on file  Social History Narrative   Not on file   Social Determinants of Health   Financial Resource Strain: Not on file  Food Insecurity: Not on file  Transportation Needs: Not on file  Physical Activity: Not on file  Stress: Not on file  Social Connections: Not on file  Intimate Partner Violence: Not on file    FAMILY HISTORY: No family history on file.  ALLERGIES:  has No Known Allergies.  MEDICATIONS:  Current Outpatient Medications  Medication Sig Dispense Refill   acetaminophen (TYLENOL) 500 MG tablet Take 500 mg by mouth every 6 (six) hours as needed for moderate pain.     docusate sodium (COLACE) 100 MG capsule Take 100 mg by  mouth daily as needed for mild constipation.     pantoprazole (PROTONIX) 40 MG tablet Take 1 tablet (40 mg total) by mouth 2 (two) times daily. 30 tablet 0   No current facility-administered medications for this visit.     PHYSICAL EXAMINATION: ECOG PERFORMANCE STATUS: 0 - Asymptomatic  Vitals:   07/05/21 1415   BP: 131/83  Pulse: (!) 50  Resp: 16  Temp: 97.7 F (36.5 C)  SpO2: 99%   Filed Weights   07/05/21 1415  Weight: 231 lb 4.8 oz (104.9 kg)    GENERAL:alert, no distress and comfortable SKIN: skin color, texture, turgor are normal, no rashes or significant lesions EYES: normal, conjunctiva are pink and non-injected, sclera clear OROPHARYNX:no exudate, no erythema and lips, buccal mucosa, and tongue normal  NECK: supple, thyroid normal size, non-tender, without nodularity LYMPH:  no palpable lymphadenopathy in the cervical, axillary LUNGS: clear to auscultation and percussion with normal breathing effort HEART: regular rate & rhythm and no murmurs and no lower extremity edema ABDOMEN:abdomen soft, non-tender and normal bowel sounds Musculoskeletal:no cyanosis of digits and no clubbing  PSYCH: alert & oriented x 3 with fluent speech NEURO: no focal motor/sensory deficits  LABORATORY DATA:  I have reviewed the data as listed Lab Results  Component Value Date   WBC 4.5 07/05/2021   HGB 11.9 (L) 07/05/2021   HCT 34.8 (L) 07/05/2021   MCV 94.3 07/05/2021   PLT 119 (L) 07/05/2021     Chemistry      Component Value Date/Time   NA 140 07/05/2021 1515   K 3.9 07/05/2021 1515   CL 107 07/05/2021 1515   CO2 27 07/05/2021 1515   BUN 16 07/05/2021 1515   CREATININE 1.26 (H) 07/05/2021 1515      Component Value Date/Time   CALCIUM 9.5 07/05/2021 1515   ALKPHOS 81 07/05/2021 1515   AST 18 07/05/2021 1515   ALT 16 07/05/2021 1515   BILITOT 0.7 07/05/2021 1515       RADIOGRAPHIC STUDIES: I have personally reviewed the radiological images as listed and agreed with the findings in the report. CT Abdomen Pelvis Wo Contrast  Result Date: 06/17/2021 CLINICAL DATA:  Abdominal pain. EXAM: CT ABDOMEN AND PELVIS WITHOUT CONTRAST TECHNIQUE: Multidetector CT imaging of the abdomen and pelvis was performed following the standard protocol without IV contrast. RADIATION DOSE REDUCTION: This  exam was performed according to the departmental dose-optimization program which includes automated exposure control, adjustment of the mA and/or kV according to patient size and/or use of iterative reconstruction technique. COMPARISON:  March 28, 2020 and April 19, 2011 FINDINGS: Lower chest: No acute abnormality. Hepatobiliary: No focal liver abnormality is seen. A small layer of tiny gallstones is seen within the dependent portion of an otherwise normal-appearing gallbladder. There is no evidence of biliary dilatation. Pancreas: Unremarkable. No pancreatic ductal dilatation or surrounding inflammatory changes. Spleen: Normal in size without focal abnormality. Adrenals/Urinary Tract: Adrenal glands are unremarkable. Kidneys are normal in size, without obstructing renal calculi, focal lesion, or hydronephrosis. Mild prominence of an extrarenal pelvis is seen on the left. A 2 mm nonobstructing renal calculus is seen within the mid right kidney. 2 mm, 3 mm, 4 mm and 5 mm nonobstructing renal calculi are seen within the mid left kidney. Bladder is unremarkable. Stomach/Bowel: Stomach is within normal limits. Appendix appears normal. No evidence of bowel wall thickening, distention, or inflammatory changes. Very small, noninflamed diverticula are seen within the sigmoid colon. Vascular/Lymphatic: No significant vascular findings are present.  Very mild, stable nonspecific mesenteric inflammatory fat stranding is seen along the medial aspect of the mid to upper left abdomen. Several subcentimeter mesenteric lymph nodes are also noted within this region. This finding is chronic and seen as far back as April 19, 2011. Reproductive: The prostate gland is surgically absent. A small right-sided hydrocele is noted. Other: No abdominal wall hernia or abnormality. No abdominopelvic ascites. Musculoskeletal: Degenerative changes seen within the lumbar spine. This is most prominent at the levels of L1-L2 and L4-L5. IMPRESSION: 1.  Bilateral subcentimeter nonobstructing renal calculi. 2. Cholelithiasis. 3. Sigmoid diverticulosis. Electronically Signed   By: Virgina Norfolk M.D.   On: 06/17/2021 17:48   DG Chest 2 View  Result Date: 06/19/2021 CLINICAL DATA:  Chest tightness EXAM: CHEST - 2 VIEW COMPARISON:  None Available. FINDINGS: Cardiac and mediastinal contours normal. Vascularity normal. No infiltrate. Minimal pleural effusion on the left. IMPRESSION: Minimal left effusion.  Negative for heart failure or pneumonia. Electronically Signed   By: Franchot Gallo M.D.   On: 06/19/2021 10:42   US RENAL  Result Date: 06/20/2021 CLINICAL DATA:  Elevated creatinine. Left renal stent placed 02/19/2018. Status post lithotripsy in 2018. EXAM: RENAL / URINARY TRACT ULTRASOUND COMPLETE COMPARISON:  CT abdomen and pelvis 06/17/2021; renal ultrasound 04/02/2018; CT abdomen and pelvis 06/17/2021 FINDINGS: Right Kidney: Renal measurements: 12.3 x 6.5 x 4.6 cm = volume: 192 mL. Normal cortical thickness and echogenicity. Within the midpole there is an echogenic focus measuring up to approximately 12 mm, however only a punctate 3 mm calcified stone was seen within the right midpole on recent 06/17/2021 CT. No hydronephrosis. Left Kidney: Renal measurements: 11.4 x 6.2 x 4.6 cm = volume: 171 mL. Multiple echogenic shadowing stones are seen within the left renal midpole. The largest measures 15 mm. On recent CT, there is a single stone or 2 adjacent stones that measure up to 13 mm within the renal midpole. No hydronephrosis. Bladder: Appears normal for degree of bladder distention. Other: None. IMPRESSION:: IMPRESSION: 1. No hydronephrosis. 2. Left renal stones measure up to 15 mm similar to recent CT. 3. An echogenic possible right renal stone is measured up to 12 mm, however the largest stone seen on recent 06/17/2021 CT was only 3 mm. The echogenicity on the current study is nonshadowing and may represent an area of focal parapelvic fat.  Electronically Signed   By: Yvonne Kendall M.D.   On: 06/20/2021 15:40   CT ENTERO ABD/PELVIS W CONTAST  Result Date: 06/27/2021 CLINICAL DATA:  Colonic diverticulosis without diverticulitis, foreign body in digestive tract, initial encounter. EXAM: CT ABDOMEN AND PELVIS WITH CONTRAST (ENTEROGRAPHY) TECHNIQUE: Multidetector CT of the abdomen and pelvis during bolus administration of intravenous contrast. Negative oral contrast was given. RADIATION DOSE REDUCTION: This exam was performed according to the departmental dose-optimization program which includes automated exposure control, adjustment of the mA and/or kV according to patient size and/or use of iterative reconstruction technique. CONTRAST:  168m OMNIPAQUE IOHEXOL 300 MG/ML  SOLN COMPARISON:  CT Jun 17, 2021 FINDINGS: Lower chest:  No acute abnormality. Hepatobiliary: No suspicious hepatic lesion. Gallbladder is unremarkable. No biliary ductal dilation. Pancreas: No pancreatic ductal dilation or evidence of acute inflammation. Spleen: No splenomegaly or focal splenic lesion. Adrenals/Urinary Tract: Bilateral adrenal glands appear normal. No hydronephrosis. Nonobstructive left renal calculi measure up to 7 mm. Kidneys demonstrate symmetric enhancement and excretion of contrast material. Urinary bladder is unremarkable for degree of distension. Stomach/Bowel: Stomach is distended with a small posterior gastric diverticulum.  Mild fatty infiltration along the wall of the first part of the duodenum for instance on coronal image 60/6 and 63/6. No pathologic dilation of small or large bowel. The terminal ileum appears normal. Appendix is not confidently identified however there is no pericecal inflammation. Left-sided colonic diverticulosis without findings of acute diverticulitis. No radiopaque foreign body identified within the GI tract. Left hemicolon is predominantly decompressed limiting evaluation. No evidence of acute bowel inflammation. No radiopaque  foreign body identified within the gastrointestinal tract. Vascular/Lymphatic: Normal caliber abdominal aorta. No pathologically enlarged abdominal or pelvic lymph nodes. Reproductive: Prostate gland appears surgically absent. Other: Mild misty appearance of the small bowel mesentery with prominent mesenteric lymph nodes measuring up to 3.3 mm. Musculoskeletal: Multilevel degenerative changes spine. No acute osseous abnormality. IMPRESSION: 1. Left-sided colonic diverticulosis without findings of acute diverticulitis. 2. No radiopaque foreign body identified within the GI tract and no evidence of acute bowel inflammation. 3. Mild fatty infiltration along the wall of the first part of the duodenum, which can be seen in the setting of chronic inflammation. 4. Mild misty appearance of the small bowel mesentery with prominent mesenteric lymph nodes measuring up to 3.3 mm. Findings can be seen in the setting of mesenteric panniculitis. 5. Nonobstructive left renal calculi measure up to 7 mm. Electronically Signed   By: Dahlia Bailiff M.D.   On: 06/27/2021 14:21   VAS [place] LOWER EXTREMITY VENOUS (DVT)  Result Date: 06/20/2021  Lower Venous DVT Study Patient Name:  Zay C Ramos  Date of Exam:   06/20/2021 Medical Rec #: [id number]         Accession #:    [id number] Date of Birth: 03-Sep-1960         Patient Gender: M Patient Age:   49 years Exam Location:  [facility] Procedure:      VAS [place] LOWER EXTREMITY VENOUS (DVT) Referring Phys: Annamaria Boots XU --------------------------------------------------------------------------------  Indications: Edema.  Risk Factors: None identified. Comparison Study: No prior studies. Performing Technologist: Oliver Hum RVT  Examination Guidelines: A complete evaluation includes B-mode imaging, spectral Doppler, color Doppler, and power Doppler as needed of all accessible portions of each vessel. Bilateral testing is considered an integral part of a complete examination. Limited  examinations for reoccurring indications may be performed as noted. The reflux portion of the exam is performed with the patient in reverse Trendelenburg.  +-----+---------------+---------+-----------+----------+--------------+ RIGHTCompressibilityPhasicitySpontaneityPropertiesThrombus Aging +-----+---------------+---------+-----------+----------+--------------+ CFV  Full           Yes      Yes                                 +-----+---------------+---------+-----------+----------+--------------+   +---------+---------------+---------+-----------+----------+--------------+ LEFT     CompressibilityPhasicitySpontaneityPropertiesThrombus Aging +---------+---------------+---------+-----------+----------+--------------+ CFV      Full           Yes      Yes                                 +---------+---------------+---------+-----------+----------+--------------+ SFJ      Full                                                        +---------+---------------+---------+-----------+----------+--------------+ FV Prox  Full                                                        +---------+---------------+---------+-----------+----------+--------------+  FV Mid   Full                                                        +---------+---------------+---------+-----------+----------+--------------+ FV DistalFull                                                        +---------+---------------+---------+-----------+----------+--------------+ PFV      Full                                                        +---------+---------------+---------+-----------+----------+--------------+ POP      Full           Yes      Yes                                 +---------+---------------+---------+-----------+----------+--------------+ PTV      Full                                                         +---------+---------------+---------+-----------+----------+--------------+ PERO     Full                                                        +---------+---------------+---------+-----------+----------+--------------+    Summary: RIGHT: - No evidence of common femoral vein obstruction.  LEFT: - There is no evidence of deep vein thrombosis in the lower extremity.  - No cystic structure found in the popliteal fossa.  *See table(s) above for measurements and observations. Electronically signed by Orlie Pollen on 06/20/2021 at 9:32:15 PM.    Final     All questions were answered. The patient knows to call the clinic with any problems, questions or concerns. I spent 45 minutes in the care of this patient including H and P, review of records, counseling and coordination of care.     Benay Pike, MD 07/05/2021 4:24 PM

## 2021-07-06 LAB — FOLATE RBC
Folate, Hemolysate: >620 ng/mL
Folate, RBC: >1766 ng/mL (ref >498)
Hematocrit: 35.1 % — ABNORMAL LOW (ref 37.5–51.0)

## 2021-07-06 LAB — TSH: TSH: 3.942 u[IU]/mL (ref 0.350–4.500)

## 2021-07-06 LAB — FERRITIN: Ferritin: 20 ng/mL — ABNORMAL LOW (ref 24–336)

## 2021-07-06 LAB — PATHOLOGIST SMEAR REVIEW

## 2021-07-10 ENCOUNTER — Telehealth: Payer: Self-pay | Admitting: *Deleted

## 2021-07-10 LAB — ANTINUCLEAR ANTIBODIES, IFA: ANA Ab, IFA: NEGATIVE

## 2021-07-10 NOTE — Telephone Encounter (Addendum)
Pt contacted - discussed with pt stating he has started the iron supplementation this weekend. No further questions at this time.  - Message from Benay Pike, MD sent at 07/08/2021 10:49 PM EDT ----- Val,  Can we recommend oral iron supplementation, ferrous sulfate 325 mg once daily. Anemia appears significantly better, thrombocytopenia also appears better.  Thanks,

## 2021-08-03 ENCOUNTER — Encounter: Payer: Self-pay | Admitting: Hematology and Oncology

## 2021-08-03 ENCOUNTER — Inpatient Hospital Stay: Payer: BC Managed Care – PPO | Attending: Hematology and Oncology | Admitting: Hematology and Oncology

## 2021-08-03 DIAGNOSIS — D696 Thrombocytopenia, unspecified: Secondary | ICD-10-CM

## 2021-08-06 ENCOUNTER — Telehealth: Payer: Self-pay | Admitting: Hematology and Oncology

## 2021-09-04 DIAGNOSIS — K222 Esophageal obstruction: Secondary | ICD-10-CM | POA: Diagnosis not present

## 2021-09-04 DIAGNOSIS — Z862 Personal history of diseases of the blood and blood-forming organs and certain disorders involving the immune mechanism: Secondary | ICD-10-CM | POA: Diagnosis not present

## 2021-09-04 DIAGNOSIS — R1319 Other dysphagia: Secondary | ICD-10-CM | POA: Diagnosis not present

## 2021-09-04 DIAGNOSIS — Z86010 Personal history of colonic polyps: Secondary | ICD-10-CM | POA: Diagnosis not present

## 2021-09-12 DIAGNOSIS — N3946 Mixed incontinence: Secondary | ICD-10-CM | POA: Diagnosis not present

## 2021-09-27 ENCOUNTER — Other Ambulatory Visit: Payer: Self-pay | Admitting: Urology

## 2021-09-27 DIAGNOSIS — N3946 Mixed incontinence: Secondary | ICD-10-CM | POA: Diagnosis not present

## 2021-10-23 DIAGNOSIS — C61 Malignant neoplasm of prostate: Secondary | ICD-10-CM | POA: Diagnosis not present

## 2021-10-23 DIAGNOSIS — N393 Stress incontinence (female) (male): Secondary | ICD-10-CM | POA: Diagnosis not present

## 2021-10-25 DIAGNOSIS — Z862 Personal history of diseases of the blood and blood-forming organs and certain disorders involving the immune mechanism: Secondary | ICD-10-CM | POA: Diagnosis not present

## 2021-10-25 DIAGNOSIS — Z0000 Encounter for general adult medical examination without abnormal findings: Secondary | ICD-10-CM | POA: Diagnosis not present

## 2021-11-02 ENCOUNTER — Other Ambulatory Visit: Payer: Self-pay

## 2021-11-02 ENCOUNTER — Inpatient Hospital Stay: Payer: BC Managed Care – PPO

## 2021-11-02 ENCOUNTER — Inpatient Hospital Stay: Payer: BC Managed Care – PPO | Attending: Hematology and Oncology | Admitting: Hematology and Oncology

## 2021-11-02 VITALS — BP 130/91 | HR 54 | Temp 97.0°F | Resp 16 | Ht 72.0 in | Wt 233.5 lb

## 2021-11-02 DIAGNOSIS — D649 Anemia, unspecified: Secondary | ICD-10-CM | POA: Diagnosis not present

## 2021-11-02 DIAGNOSIS — D696 Thrombocytopenia, unspecified: Secondary | ICD-10-CM | POA: Insufficient documentation

## 2021-11-02 LAB — COMPREHENSIVE METABOLIC PANEL
ALT: 20 U/L (ref 0–44)
AST: 20 U/L (ref 15–41)
Albumin: 4.3 g/dL (ref 3.5–5.0)
Alkaline Phosphatase: 82 U/L (ref 38–126)
Anion gap: 3 — ABNORMAL LOW (ref 5–15)
BUN: 18 mg/dL (ref 8–23)
CO2: 31 mmol/L (ref 22–32)
Calcium: 9.2 mg/dL (ref 8.9–10.3)
Chloride: 107 mmol/L (ref 98–111)
Creatinine, Ser: 1.13 mg/dL (ref 0.61–1.24)
GFR, Estimated: >60 mL/min (ref >60)
Glucose, Bld: 95 mg/dL (ref 70–99)
Potassium: 4.1 mmol/L (ref 3.5–5.1)
Sodium: 141 mmol/L (ref 135–145)
Total Bilirubin: 0.8 mg/dL (ref 0.3–1.2)
Total Protein: 6.5 g/dL (ref 6.5–8.1)

## 2021-11-02 LAB — CBC WITH DIFFERENTIAL/PLATELET
Abs Immature Granulocytes: 0.02 10*3/uL (ref 0.00–0.07)
Basophils Absolute: 0 10*3/uL (ref 0.0–0.1)
Basophils Relative: 0 %
Eosinophils Absolute: 0.3 10*3/uL (ref 0.0–0.5)
Eosinophils Relative: 6 %
HCT: 41 % (ref 39.0–52.0)
Hemoglobin: 14.9 g/dL (ref 13.0–17.0)
Immature Granulocytes: 0 %
Lymphocytes Relative: 21 %
Lymphs Abs: 1.1 10*3/uL (ref 0.7–4.0)
MCH: 33.8 pg (ref 26.0–34.0)
MCHC: 36.3 g/dL — ABNORMAL HIGH (ref 30.0–36.0)
MCV: 93 fL (ref 80.0–100.0)
Monocytes Absolute: 0.4 10*3/uL (ref 0.1–1.0)
Monocytes Relative: 8 %
Neutro Abs: 3.4 10*3/uL (ref 1.7–7.7)
Neutrophils Relative %: 65 %
Platelets: 105 10*3/uL — ABNORMAL LOW (ref 150–400)
RBC: 4.41 MIL/uL (ref 4.22–5.81)
RDW: 14 % (ref 11.5–15.5)
WBC: 5.3 10*3/uL (ref 4.0–10.5)
nRBC: 0 % (ref 0.0–0.2)

## 2021-11-02 LAB — TSH: TSH: 3.324 u[IU]/mL (ref 0.350–4.500)

## 2021-11-02 NOTE — Progress Notes (Signed)
[name] CONSULT NOTE  Patient Care Team: Collene Leyden, MD as PCP - General (Family Medicine)  CHIEF COMPLAINTS/PURPOSE OF CONSULTATION:  Anemia and thrombocytopenia  ASSESSMENT & PLAN:   This is a very pleasant 61 year old male patient with past medical history significant for kidney stones and recent GI bleed referred to hematology for evaluation of thrombocytopenia.   Patient is here for follow-up.  Since last visit, he continues to feel well, has been taking iron.  He denies any new complaints such as bleeding issues.  He does now recollect that he had thrombocytopenia for a long time.  He is hoping to have urinary sling as well as an esophageal stretch done and was wondering if this is safe with his degree of thrombocytopenia.  He most likely has chronic ITP with mild thrombocytopenia, I believe his counts are adequate to proceed with any procedure at this time. Given correction of anemia, recommended that he take iron mentation for couple more months and discontinue it.  He was given option to follow-up with [place] once a year versus PCP and he will continue to follow-up with his primary care physician. Thank you for consulting [place] the care of this patient.  Please do not hesitate to contact us with any additional questions or concerns.  HISTORY OF PRESENTING ILLNESS:   Adam Ramos 61 y.o. male is here because of anemia and thrombocytopenia.  He arrived to the appointment with his sister who is a Equities trader.   He had radical prostatectomy for prostate cancer and lymph node dissection, otherwise no adjuvant Lupron or brachytherapy.  Interval history  [name] is here for follow-up.  Since his last visit, no more bleeding issues.  He continues to take iron once a day.  He is hoping to go through a urinary sling surgery as well as esophageal stretch.  He tells me that he probably had thrombocytopenia for a long time from his memory.  Rest of the pertinent 10 point  ROS reviewed and negative  MEDICAL HISTORY:  Past Medical History:  Diagnosis Date   GERD (gastroesophageal reflux disease)    History of kidney stones    Nocturia    Prostate cancer [facility]) urologist--- dr Louis Meckel   s/p prostatectomy 05-17-2019   Urinary incontinence    Wears contact lenses     SURGICAL HISTORY: Past Surgical History:  Procedure Laterality Date   BIOPSY  06/18/2021   Procedure: BIOPSY;  Surgeon: Ronnette Juniper, MD;  Location: [facility];  Service: Gastroenterology;;   CYSTOSCOPY/URETEROSCOPY/HOLMIUM LASER/STENT PLACEMENT Left 02/19/2018   Procedure: LEFT URETEROSCOPY/HOLMIUM LASER/ STONE REMOVAL  STENT PLACEMENT;  Surgeon: Ardis Hughs, MD;  Location: [facility];  Service: Urology;  Laterality: Left;   ESOPHAGOGASTRODUODENOSCOPY (EGD) WITH PROPOFOL N/A 06/18/2021   Procedure: ESOPHAGOGASTRODUODENOSCOPY (EGD) WITH PROPOFOL;  Surgeon: Ronnette Juniper, MD;  Location: [facility];  Service: Gastroenterology;  Laterality: N/A;   ESOPHAGOGASTRODUODENOSCOPY (EGD) WITH PROPOFOL N/A 06/20/2021   Procedure: ESOPHAGOGASTRODUODENOSCOPY (EGD) WITH PROPOFOL;  Surgeon: Ronnette Juniper, MD;  Location: [facility];  Service: Gastroenterology;  Laterality: N/A;   EXTRACORPOREAL SHOCK WAVE LITHOTRIPSY Right 09/02/2016   Procedure: RIGHT EXTRACORPOREAL SHOCK WAVE LITHOTRIPSY (ESWL);  Surgeon: Nickie Retort, MD;  Location: [facility];  Service: Urology;  Laterality: Right;   EXTRACORPOREAL SHOCK WAVE LITHOTRIPSY  02-17-2014;  05-20-2011  '@[facility]'$    FOREIGN BODY REMOVAL N/A 09/02/2019   Procedure: CYSTOSCOPY FOREIGN BODY REMOVAL ADULT;  Surgeon: Ardis Hughs, MD;  Location: [facility];  Service: Urology;  Laterality: N/A;   GIVENS CAPSULE STUDY N/A 06/20/2021   Procedure: GIVENS CAPSULE STUDY;  Surgeon: Ronnette Juniper, MD;  Location: [facility];  Service: Gastroenterology;  Laterality: N/A;   HOLMIUM LASER APPLICATION Left 02/17/3843   Procedure: HOLMIUM LASER  APPLICATION;  Surgeon: Ardis Hughs, MD;  Location[facility];  Service: Urology;  Laterality: Left;   PELVIC LYMPH NODE DISSECTION Bilateral 05/17/2019   Procedure: PELVIC LYMPH NODE DISSECTION;  Surgeon: Ardis Hughs, MD;  Location: [facility];  Service: Urology;  Laterality: Bilateral;   ROBOT ASSISTED LAPAROSCOPIC RADICAL PROSTATECTOMY N/A 05/17/2019   Procedure: XI ROBOTIC ASSISTED LAPAROSCOPIC RADICAL PROSTATECTOMY;  Surgeon: Ardis Hughs, MD;  Location: [facility];  Service: Urology;  Laterality: N/A;   SHOULDER ARTHROSCOPY Right early 2000s   removal spurs    SOCIAL HISTORY: Social History   Socioeconomic History   Marital status: Single    Spouse name: Not on file   Number of children: Not on file   Years of education: Not on file   Highest education level: Not on file  Occupational History   Not on file  Tobacco Use   Smoking status: Former    Years: 4.00    Types: Cigarettes    Quit date: 02/12/1983    Years since quitting: 38.7   Smokeless tobacco: Never  Vaping Use   Vaping Use: Never used  Substance and Sexual Activity   Alcohol use: Yes    Comment: occassional    Drug use: No   Sexual activity: Not on file  Other Topics Concern   Not on file  Social History Narrative   Not on file   Social Determinants of Health   Financial Resource Strain: Not on file  Food Insecurity: Not on file  Transportation Needs: Not on file  Physical Activity: Not on file  Stress: Not on file  Social Connections: Not on file  Intimate Partner Violence: Not on file    FAMILY HISTORY: No family history on file.  ALLERGIES:  has No Known Allergies.  MEDICATIONS:  Current Outpatient Medications  Medication Sig Dispense Refill   acetaminophen (TYLENOL) 500 MG tablet Take 500 mg by mouth every 6 (six) hours as needed for moderate pain.     docusate sodium (COLACE) 100 MG capsule Take 100 mg by mouth daily as needed for mild constipation.      pantoprazole (PROTONIX) 40 MG tablet Take 1 tablet (40 mg total) by mouth 2 (two) times daily. 30 tablet 0   No current facility-administered medications for this visit.     PHYSICAL EXAMINATION: ECOG PERFORMANCE STATUS: 0 - Asymptomatic  Physical Exam Constitutional:      Appearance: Normal appearance.  Pulmonary:     Effort: Pulmonary effort is normal.     Breath sounds: Normal breath sounds.  Abdominal:     General: Abdomen is flat. Bowel sounds are normal.     Palpations: Abdomen is soft.  Musculoskeletal:        General: Normal range of motion.     Cervical back: Normal range of motion and neck supple. No rigidity.  Lymphadenopathy:     Cervical: No cervical adenopathy.  Skin:    General: Skin is warm and dry.  Neurological:     Mental Status: He is alert.      LABORATORY DATA:  I have reviewed the data as listed Lab Results  Component Value Date   WBC 5.3 11/02/2021   HGB 14.9 11/02/2021  HCT 41.0 11/02/2021   MCV 93.0 11/02/2021   PLT 105 (L) 11/02/2021     Chemistry      Component Value Date/Time   NA 140 07/05/2021 1515   K 3.9 07/05/2021 1515   CL 107 07/05/2021 1515   CO2 27 07/05/2021 1515   BUN 16 07/05/2021 1515   CREATININE 1.26 (H) 07/05/2021 1515      Component Value Date/Time   CALCIUM 9.5 07/05/2021 1515   ALKPHOS 81 07/05/2021 1515   AST 18 07/05/2021 1515   ALT 16 07/05/2021 1515   BILITOT 0.7 07/05/2021 1515        RADIOGRAPHIC STUDIES: I have personally reviewed the radiological images as listed and agreed with the findings in the report. No results found.  All questions were answered. The patient knows to call the clinic with any problems, questions or concerns.  I spent  20 minutes in the care of this patient including History, review of records, counseling and coordination of care.     Benay Pike, MD 11/02/2021 11:36 AM

## 2021-11-12 ENCOUNTER — Other Ambulatory Visit (HOSPITAL_COMMUNITY): Payer: BC Managed Care – PPO

## 2021-11-12 DIAGNOSIS — H40029 Open angle with borderline findings, high risk, unspecified eye: Secondary | ICD-10-CM | POA: Diagnosis not present

## 2021-11-20 ENCOUNTER — Ambulatory Visit: Admit: 2021-11-20 | Payer: BC Managed Care – PPO | Admitting: Urology

## 2021-11-20 SURGERY — INSERTION, ARTIFICIAL URINARY SPHINCTER
Anesthesia: General

## 2021-11-22 DIAGNOSIS — K314 Gastric diverticulum: Secondary | ICD-10-CM | POA: Diagnosis not present

## 2021-11-22 DIAGNOSIS — K2930 Chronic superficial gastritis without bleeding: Secondary | ICD-10-CM | POA: Diagnosis not present

## 2021-11-22 DIAGNOSIS — K3189 Other diseases of stomach and duodenum: Secondary | ICD-10-CM | POA: Diagnosis not present

## 2021-11-22 DIAGNOSIS — K222 Esophageal obstruction: Secondary | ICD-10-CM | POA: Diagnosis not present

## 2021-11-22 DIAGNOSIS — K200 Eosinophilic esophagitis: Secondary | ICD-10-CM | POA: Diagnosis not present

## 2021-11-22 DIAGNOSIS — R1310 Dysphagia, unspecified: Secondary | ICD-10-CM | POA: Diagnosis not present

## 2021-11-22 DIAGNOSIS — K2980 Duodenitis without bleeding: Secondary | ICD-10-CM | POA: Diagnosis not present

## 2021-11-26 DIAGNOSIS — N3941 Urge incontinence: Secondary | ICD-10-CM | POA: Diagnosis not present

## 2021-11-26 DIAGNOSIS — N393 Stress incontinence (female) (male): Secondary | ICD-10-CM | POA: Diagnosis not present

## 2021-11-26 DIAGNOSIS — R3121 Asymptomatic microscopic hematuria: Secondary | ICD-10-CM | POA: Diagnosis not present

## 2021-11-26 DIAGNOSIS — C61 Malignant neoplasm of prostate: Secondary | ICD-10-CM | POA: Diagnosis not present

## 2021-12-19 DIAGNOSIS — Z8546 Personal history of malignant neoplasm of prostate: Secondary | ICD-10-CM | POA: Diagnosis not present

## 2021-12-25 DIAGNOSIS — Z8546 Personal history of malignant neoplasm of prostate: Secondary | ICD-10-CM | POA: Diagnosis not present

## 2021-12-25 DIAGNOSIS — N5231 Erectile dysfunction following radical prostatectomy: Secondary | ICD-10-CM | POA: Diagnosis not present

## 2021-12-25 DIAGNOSIS — N393 Stress incontinence (female) (male): Secondary | ICD-10-CM | POA: Diagnosis not present

## 2022-01-08 DIAGNOSIS — H401131 Primary open-angle glaucoma, bilateral, mild stage: Secondary | ICD-10-CM | POA: Diagnosis not present

## 2022-01-15 ENCOUNTER — Encounter: Payer: Self-pay | Admitting: Internal Medicine

## 2022-01-15 ENCOUNTER — Other Ambulatory Visit: Payer: Self-pay

## 2022-01-15 ENCOUNTER — Ambulatory Visit (INDEPENDENT_AMBULATORY_CARE_PROVIDER_SITE_OTHER): Payer: BC Managed Care – PPO | Admitting: Internal Medicine

## 2022-01-15 VITALS — BP 140/92 | HR 70 | Temp 98.2°F | Resp 16 | Ht 72.0 in | Wt 236.9 lb

## 2022-01-15 DIAGNOSIS — K200 Eosinophilic esophagitis: Secondary | ICD-10-CM

## 2022-01-15 DIAGNOSIS — J3089 Other allergic rhinitis: Secondary | ICD-10-CM | POA: Diagnosis not present

## 2022-01-15 DIAGNOSIS — J302 Other seasonal allergic rhinitis: Secondary | ICD-10-CM

## 2022-01-15 DIAGNOSIS — H1013 Acute atopic conjunctivitis, bilateral: Secondary | ICD-10-CM

## 2022-01-15 MED ORDER — OLOPATADINE HCL 0.2 % OP SOLN: 1.0000 [drp] | Freq: Every day | OPHTHALMIC | 5 refills | Status: DC | PRN

## 2022-01-15 MED ORDER — AZELASTINE HCL 0.1 % NA SOLN: 1.0000 | Freq: Two times a day (BID) | NASAL | 5 refills | Status: DC

## 2022-01-15 MED ORDER — CETIRIZINE HCL 10 MG PO TABS: 10.0000 mg | ORAL_TABLET | Freq: Every day | ORAL | 5 refills | Status: DC

## 2022-01-15 NOTE — Progress Notes (Signed)
NEW PATIENT  Date of Service/Encounter:  01/15/22  Consult requested by: Collene Leyden, MD   Subjective:   Adam Ramos (DOB: Apr 24, 1960) is a 61 y.o. male who presents to the clinic on 01/15/2022 with a chief complaint of Allergy Testing .    History obtained from: chart review and patient.   EoE: Reports symptoms initially started around age 25.  He has had intermittent episodes about 4-5x/year where he has trouble swallowing pills/solid foods and choking.  He recently was admitted for melena and underwent EGD in the hospital and then a repeat EGD outpatient with Dr. Therisa Doyne, GI.  They did a pillcam and he had trouble swallowing it also.  Reports after the EGD, he was told he has EoE and started on Protonix and Budesonide '5mg'$  twice daily. I have reviewed EGD results below that show ulcers and esophageal stenosis; I do not see any pathology results noting elevated esophageal eosinophils though.  He has noticed grits and poultry cause worse symptoms.  He has no trouble with liquids.  Outside of the 4-5 episodes a year, he is fine.  He has not had any further symptoms since starting Protonix and Budesonide; has noted some more joint pain with starting Protonix.  He does eat walnuts/pecans and sometimes fish.    Rhinitis:  Started around age 83.  Symptoms include: nasal congestion, rhinorrhea, watery eyes, and itchy eyes  Occurs seasonally-Spring and Fall Potential triggers: pollen Treatments tried:  None Previous allergy testing: no History of chronic sinusitis or sinus surgery: no  Past Medical History: Past Medical History:  Diagnosis Date   GERD (gastroesophageal reflux disease)    History of kidney stones    Nocturia    Prostate cancer [facility]) urologist--- dr Louis Meckel   s/p prostatectomy 05-17-2019   Urinary incontinence    Wears contact lenses    Past Surgical History: Past Surgical History:  Procedure Laterality Date   BIOPSY  06/18/2021   Procedure: BIOPSY;  Surgeon:  Ronnette Juniper, MD;  Location: [facility];  Service: Gastroenterology;;   CYSTOSCOPY/URETEROSCOPY/HOLMIUM LASER/STENT PLACEMENT Left 02/19/2018   Procedure: LEFT URETEROSCOPY/HOLMIUM LASER/ STONE REMOVAL  STENT PLACEMENT;  Surgeon: Ardis Hughs, MD;  Location: [facility];  Service: Urology;  Laterality: Left;   ESOPHAGOGASTRODUODENOSCOPY (EGD) WITH PROPOFOL N/A 06/18/2021   Procedure: ESOPHAGOGASTRODUODENOSCOPY (EGD) WITH PROPOFOL;  Surgeon: Ronnette Juniper, MD;  [facility];  Service: Gastroenterology;  Laterality: N/A;   ESOPHAGOGASTRODUODENOSCOPY (EGD) WITH PROPOFOL N/A 06/20/2021   Procedure: ESOPHAGOGASTRODUODENOSCOPY (EGD) WITH PROPOFOL;  Surgeon: Ronnette Juniper, MD;  Location: [facility];  Service: Gastroenterology;  Laterality: N/A;   EXTRACORPOREAL SHOCK WAVE LITHOTRIPSY Right 09/02/2016   Procedure: RIGHT EXTRACORPOREAL SHOCK WAVE LITHOTRIPSY (ESWL);  Surgeon: Nickie Retort, MD;  Location[facility];  Service: Urology;  Laterality: Right;   EXTRACORPOREAL SHOCK WAVE LITHOTRIPSY  02-17-2014;  05-20-2011  '@[facility]'$    FOREIGN BODY REMOVAL N/A 09/02/2019   Procedure: CYSTOSCOPY FOREIGN BODY REMOVAL ADULT;  Surgeon: Ardis Hughs, MD;  Location: [facility];  Service: Urology;  Laterality: N/A;   GIVENS CAPSULE STUDY N/A 06/20/2021   Procedure: GIVENS CAPSULE STUDY;  Surgeon: Ronnette Juniper, MD;  Location: [facility];  Service: Gastroenterology;  Laterality: N/A;   HOLMIUM LASER APPLICATION Left 06/18/175   Procedure: HOLMIUM LASER APPLICATION;  Surgeon: Ardis Hughs, MD;  Location: [facility];  Service: Urology;  Laterality: Left;   PELVIC LYMPH NODE DISSECTION Bilateral 05/17/2019   Procedure: PELVIC LYMPH NODE DISSECTION;  Surgeon:  Ardis Hughs, MD;  Location: [facility];  Service: Urology;  Laterality: Bilateral;   ROBOT ASSISTED LAPAROSCOPIC RADICAL PROSTATECTOMY N/A 05/17/2019   Procedure: XI ROBOTIC ASSISTED LAPAROSCOPIC  RADICAL PROSTATECTOMY;  Surgeon: Ardis Hughs, MD;  Location: [facility];  Service: Urology;  Laterality: N/A;   SHOULDER ARTHROSCOPY Right early 2000s   removal spurs    Family History: History reviewed. No pertinent family history.  Social History:  Lives in a 1974 year house Flooring in bedroom: carpet Pets: none Tobacco use/exposure: 4 pack years, quit 1981 Job: Banker  Medication List:  Allergies as of 01/15/2022   No Known Allergies      Medication List        Accurate as of January 15, 2022  4:44 PM. If you have any questions, ask your nurse or doctor.          acetaminophen 500 MG tablet Commonly known as: TYLENOL Take 500 mg by mouth every 6 (six) hours as needed for moderate pain.   docusate sodium 100 MG capsule Commonly known as: COLACE Take 100 mg by mouth daily as needed for mild constipation.   multivitamin capsule Take 1 capsule by mouth daily.   pantoprazole 40 MG tablet Commonly known as: Protonix Take 1 tablet (40 mg total) by mouth 2 (two) times daily.   PROBIOTIC DAILY PO Take by mouth.   Vitamin C 500 MG Caps Take 500 mg by mouth daily.         REVIEW OF SYSTEMS: Pertinent positives and negatives discussed in HPI.   Objective:   Physical Exam: BP (!) 140/92 (BP Location: Right Arm, Patient Position: Sitting, Cuff Size: Normal)   Pulse 70   Temp 98.2 F (36.8 C) (Temporal)   Resp 16   Ht 6' (1.829 m)   Wt 236 lb 14.4 oz (107.5 kg)   SpO2 96%   BMI 32.13 kg/m  Body mass index is 32.13 kg/m. GEN: alert, well developed HEENT: clear conjunctiva, TM grey and translucent, nose with + inferior turbinate hypertrophy, pink nasal mucosa, no rhinorrhea, + cobblestoning HEART: regular rate and rhythm, no murmur LUNGS: clear to auscultation bilaterally, no coughing, unlabored respiration ABDOMEN: soft, non distended  SKIN: no rashes or lesions  Reviewed:  06/18/2021: EGD - Normal upper third of esophagus and middle  third of esophagus. - Benign-appearing esophageal stenosis.Traversed with minimal resistance at the GE junction. - Normal stomach. - Acute duodenitis. - Non-bleeding duodenal ulcers with a clean ulcer base (Forrest Class III). - Normal second portion of the duodenum and third portion of the duodenum.  06/20/2021: EGD - Normal upper third of esophagus and middle third of esophagus. - Benign-appearing esophageal stenosis. - Erythematous mucosa in the antrum. - Non-bleeding duodenal ulcers with a clean - Erythematous mucosa in the antrum. - Non-bleeding duodenal ulcers with a clean ulcer base (Forrest Class III). - Successful completion of the Video Capsule Enteroscope placement.  Skin Testing:  Skin prick testing was placed, which includes aeroallergens/foods, histamine control, and saline control.  Verbal consent was obtained prior to placing test.  Patient tolerated procedure well.  Allergy testing results were read and interpreted by myself, documented by clinical staff. Adequate positive and negative control.  Results discussed with patient/family.  Airborne Adult Perc - 01/15/22 1441     Time Antigen Placed 1441    Allergen Manufacturer Lavella Hammock    Location Back    Number of Test 59    Panel 1 Select    1. Control-Buffer  50% Glycerol Negative    2. Control-Histamine 1 mg/ml 3+    3. Albumin saline Negative    4. [place] 3+    5. [place] Negative    6. [place] Negative    7. [place] Blue 3+    8. Meadow Fescue 3+    9. Perennial Rye 3+    10. Sweet Vernal 3+    11. Timothy 3+    12. Cocklebur 3+    13. Burweed Marshelder 2+    14. Ragweed, short Negative    15. Ragweed, Giant 3+    16. Plantain,  English Negative    17. Lamb's Quarters Negative    18. Sheep Sorrell Negative    19. Rough Pigweed Negative    20. Marsh Elder, Rough 2+    21. Mugwort, Common 2+    22. Ash mix Negative    23. Birch mix 2+    24. Beech American Negative    25. Box, Elder Negative    26.  Cedar, red Negative    27. Cottonwood, [place] Negative    28. Elm mix Negative    29. Hickory Negative    30. Maple mix 2+    31. Oak, [place] mix Negative    32. Pecan Pollen Negative    33. Pine mix Negative    34. Sycamore Eastern Negative    35. [place], Black Pollen Negative    36. Alternaria alternata Negative    37. Cladosporium Herbarum Negative    38. Aspergillus mix Negative    39. Penicillium mix Negative    40. Bipolaris sorokiniana (Helminthosporium) Negative    41. Drechslera spicifera (Curvularia) Negative    42. Mucor plumbeus Negative    43. Fusarium moniliforme Negative    44. Aureobasidium pullulans (pullulara) Negative    45. Rhizopus oryzae Negative    46. Botrytis cinera Negative    47. Epicoccum nigrum Negative    48. Phoma betae Negative    49. Candida Albicans Negative    50. Trichophyton mentagrophytes Negative    51. Mite, D Farinae  5,000 AU/ml Negative    52. Mite, D Pteronyssinus  5,000 AU/ml Negative    53. Cat Hair 10,000 BAU/ml Negative    54.  Dog Epithelia Negative    55. Mixed Feathers Negative    56. Horse Epithelia Negative    57. Cockroach, German Negative    58. Mouse 2+    59. Tobacco Leaf Negative             Intradermal - 01/15/22 1557     Control Negative    Mold 1 3+    Mold 2 2+    Mold 3 Negative    Mold 4 Negative    Cat Negative    Dog Negative    Cockroach Negative    Mite mix 3+             Food Adult Perc - 01/15/22 1400     Time Antigen Placed 1441    Allergen Manufacturer Lavella Hammock    Location Back    Number of allergen test 27    1. Peanut Negative    2. Soybean Negative    3. Wheat Negative    4. Sesame Negative    5. Milk, cow Negative    6. Egg White, Chicken Negative    7. Casein Negative    8. Shellfish Mix Negative    9. Fish Mix 2+    10. Cashew Negative  11. Pecan Food Negative    12. [place] Negative    13. Almond Negative    14. Hazelnut 3+    15. [place] nut Negative    16.  Coconut Negative    17. Pistachio Negative    30. Barley Negative    31. Oat  Negative    32. Rye  Negative    33. Hops Negative    34. Rice Negative    37. Pork Negative    38. [place] Meat Negative    39. Chicken Meat Negative    40. Beef Negative    41. Lamb Negative               Assessment:   1. Eosinophilic esophagitis   2. Seasonal and perennial allergic rhinitis   3. Allergic conjunctivitis of both eyes     Plan/Recommendations:   Allergic Rhinitis Allergic Conjunctivitis - Positive skin test 01/2022: trees, grasses, weeds, mold, dust mite.  - Avoidance measures discussed. - Use nasal saline rinses before nose sprays such as with Neilmed Sinus Rinse.  Use distilled water.   - Use Azelastine 1-2 sprays each nostril twice daily as needed. Aim upward and outward. - Use Zyrtec 10 mg daily as needed for runny nose or itchy watery eyes.  - For eyes, use Olopatadine or Ketotifen 1 eye drop daily as needed for itchy, watery eyes.  Available over the counter, if not covered by insurance.  - Consider allergy shots as long term control of your symptoms by teaching your immune system to be more tolerant of your allergy triggers   Eosinophilic Esophagitis - EGD from GI 06/2021 does note esophageal stenosis but I was unable to find pathology results with elevated eosinophils.    - SPT 01/15/2022 positive to hazelnut and fish.  Unfortunately, food testing has limited diagnostic sensitivity/specificity for EoE.  Recommend avoidance of treenuts and fish for 12 weeks to see if your symptoms improve.   If still no improvement, can also try 2 food elimination diet with avoidance of all cow's milk and wheat products for 12 weeks.  - Continue follow up with GI. - Continue Budesonide slurry and Protonix.  - If symptoms become frequent or uncontrolled, can discuss Dupixent.      Return in about 3 months (around 04/16/2022).  Harlon Flor, MD [facility] [place] [facility] [place]

## 2022-01-15 NOTE — Patient Instructions (Addendum)
Rhinitis: - Positive skin test 01/2022: trees, grasses, weeds, mold, dust mite.  - Avoidance measures discussed. - Use nasal saline rinses before nose sprays such as with Neilmed Sinus Rinse.  Use distilled water.   - Use Azelastine 1-2 sprays each nostril twice daily as needed. Aim upward and outward. - Use Zyrtec 10 mg daily as needed for runny nose or itchy watery eyes.  - For eyes, use Olopatadine or Ketotifen 1 eye drop daily as needed for itchy, watery eyes.  Available over the counter, if not covered by insurance.  - Consider allergy shots as long term control of your symptoms by teaching your immune system to be more tolerant of your allergy triggers   Eosinophilic Esophagitis - SPT 01/15/2022 positive to hazelnut and fish.  Unfortunately, food testing has limited diagnostic sensitivity/specificity for EoE. Recommend avoidance of treenuts and fish for 12 weeks to see if your symptoms improve.  If still no improvement, can also try 2 food elimination diet with avoidance of all cow's milk and wheat products for 12 weeks.  - Continue follow up with GI. - Continue Budesonide slurry and Protonix.  - If symptoms become frequent or uncontrolled, can discuss Dupixent.

## 2022-01-21 DIAGNOSIS — N393 Stress incontinence (female) (male): Secondary | ICD-10-CM | POA: Diagnosis not present

## 2022-01-21 DIAGNOSIS — Z9079 Acquired absence of other genital organ(s): Secondary | ICD-10-CM | POA: Diagnosis not present

## 2022-01-21 HISTORY — PX: OTHER SURGICAL HISTORY: SHX169

## 2022-02-12 DIAGNOSIS — N3941 Urge incontinence: Secondary | ICD-10-CM | POA: Diagnosis not present

## 2022-02-12 DIAGNOSIS — N393 Stress incontinence (female) (male): Secondary | ICD-10-CM | POA: Diagnosis not present

## 2022-02-12 DIAGNOSIS — C61 Malignant neoplasm of prostate: Secondary | ICD-10-CM | POA: Diagnosis not present

## 2022-03-20 DIAGNOSIS — H401131 Primary open-angle glaucoma, bilateral, mild stage: Secondary | ICD-10-CM | POA: Diagnosis not present

## 2022-04-15 DIAGNOSIS — H1033 Unspecified acute conjunctivitis, bilateral: Secondary | ICD-10-CM | POA: Diagnosis not present

## 2022-04-16 ENCOUNTER — Ambulatory Visit (INDEPENDENT_AMBULATORY_CARE_PROVIDER_SITE_OTHER): Payer: BC Managed Care – PPO | Admitting: Internal Medicine

## 2022-04-16 ENCOUNTER — Other Ambulatory Visit: Payer: Self-pay

## 2022-04-16 ENCOUNTER — Encounter: Payer: Self-pay | Admitting: Internal Medicine

## 2022-04-16 VITALS — BP 124/82 | HR 67 | Temp 97.9°F | Resp 18 | Wt 233.3 lb

## 2022-04-16 DIAGNOSIS — J3089 Other allergic rhinitis: Secondary | ICD-10-CM | POA: Diagnosis not present

## 2022-04-16 DIAGNOSIS — K200 Eosinophilic esophagitis: Secondary | ICD-10-CM | POA: Diagnosis not present

## 2022-04-16 DIAGNOSIS — J302 Other seasonal allergic rhinitis: Secondary | ICD-10-CM | POA: Diagnosis not present

## 2022-04-16 MED ORDER — OLOPATADINE HCL 0.2 % OP SOLN: 1.0000 [drp] | Freq: Every day | OPHTHALMIC | 5 refills | Status: AC | PRN

## 2022-04-16 MED ORDER — CETIRIZINE HCL 10 MG PO TABS: 10.0000 mg | ORAL_TABLET | Freq: Every day | ORAL | 5 refills | Status: DC

## 2022-04-16 MED ORDER — AZELASTINE HCL 0.1 % NA SOLN: 1.0000 | Freq: Two times a day (BID) | NASAL | 5 refills | Status: DC

## 2022-04-16 NOTE — Progress Notes (Signed)
FOLLOW UP Date of Service/Encounter:  04/16/22   Subjective:  Adam Ramos (DOB: 1960-05-08) is a 63 y.o. male who returns to the [facility] on 04/16/2022 for follow up for  allergic rhinoconjunctivitis and eosinophilic esophagitis.   History obtained from: chart review and patient. Last visit was with me 01/15/2022 for allergic rhinoconjunctivitis and EoE.  Positive SPT to TGW, Mold and DM.  Started on Azelastine and Zyrtec. Positive SPT to hazelnut and fish; also discussed 2 food elimination with milk/wheat and to follow up with GI (on Protonix and budesonide slurry by them).  Since last visit, he has stopped eating fish and has mostly avoided treenuts except for eating a pecan pie once at Christmas.  Denies any choking episodes like prior. Did have 2 episodes of slight trouble swallowing but denies any regurgitation.  He has not followed up with GI recently.  Has finished the Budesonide Slurry but still doing Protonix BID.    In terms of his allergies, he reports last week he did have increased congestion and drainage but it is getting better. Not using his Zyrtec or Azelastine discussed last visit.  Sometimes has ocular symptoms with itchy watery eyes but not using anything.   Past Medical History: Past Medical History:  Diagnosis Date   GERD (gastroesophageal reflux disease)    History of kidney stones    Nocturia    Prostate cancer [facility]) urologist--- dr Louis Meckel   s/p prostatectomy 05-17-2019   Urinary incontinence    Wears contact lenses     Objective:  BP 124/82   Pulse 67   Temp 97.9 F (36.6 C) (Temporal)   Resp 18   Wt 233 lb 4.8 oz (105.8 kg)   SpO2 96%   BMI 31.64 kg/m  Body mass index is 31.64 kg/m. Physical Exam: GEN: alert, well developed HEENT: clear conjunctiva, nose with moderate inferior turbinate hypertrophy, pale nasal mucosa, clear rhinorrhea, + cobblestoning HEART: regular rate and rhythm, no murmur LUNGS: clear to auscultation  bilaterally, no coughing, unlabored respiration SKIN: no rashes or lesions   Assessment:   1. Seasonal and perennial allergic rhinitis   2. Eosinophilic esophagitis     Plan/Recommendations:  Allergic Rhinitis: - Uncontrolled, discussed use of PRN INAH and OAH.   - Positive skin test 01/2022: trees, grasses, weeds, mold, dust mite.  - Avoidance measures discussed. - Use nasal saline rinses before nose sprays such as with Neilmed Sinus Rinse.  Use distilled water.   - Use Azelastine 1-2 sprays each nostril twice daily as needed. Aim upward and outward. - Use Zyrtec 10 mg daily as needed for runny nose or itchy watery eyes.  - For eyes, use Olopatadine or Ketotifen 1 eye drop daily as needed for itchy, watery eyes.  Available over the counter, if not covered by insurance.  - Consider allergy shots as long term control of your symptoms by teaching your immune system to be more tolerant of your allergy triggers  Eosinophilic Esophagitis - Some improvement with food avoidance.  Also was on Budesonide Slurry and still Protonix.   - EGD from GI 06/2021 does note esophageal stenosis but I was unable to find pathology results with elevated eosinophils.     - SPT 01/15/2022 positive to hazelnut and fish.  Unfortunately, food testing has limited diagnostic sensitivity/specificity for EoE. Some improvement with avoidance of fish and treenut so would continue avoidance of these foods.  - If symptoms worsen, can also try 2 food elimination diet with avoidance  of all cow's milk and wheat products for 12 weeks.  - Please reschedule a follow up with GI. - Continue Protonix. Previously was on budesonide slurry.   Return in about 6 months (around 10/17/2022).  Harlon Flor, MD [facility]

## 2022-04-16 NOTE — Patient Instructions (Addendum)
Allergic Rhinitis: - Positive skin test 01/2022: trees, grasses, weeds, mold, dust mite.  - Avoidance measures discussed. - Use nasal saline rinses before nose sprays such as with Neilmed Sinus Rinse.  Use distilled water.   - Use Azelastine 1-2 sprays each nostril twice daily as needed. Aim upward and outward. - Use Zyrtec 10 mg daily as needed for runny nose or itchy watery eyes.  - For eyes, use Olopatadine or Ketotifen 1 eye drop daily as needed for itchy, watery eyes.  Available over the counter, if not covered by insurance.  - Consider allergy shots as long term control of your symptoms by teaching your immune system to be more tolerant of your allergy triggers  Eosinophilic Esophagitis - SPT 01/15/2022 positive to hazelnut and fish.  Unfortunately, food testing has limited diagnostic sensitivity/specificity for EoE. Some improvement with avoidance of fish and treenut so would continue avoidance of these foods.  - If symptoms worsen, can also try 2 food elimination diet with avoidance of all cow's milk and wheat products for 12 weeks.  - Please reschedule a follow up with GI. - Continue Protonix.

## 2022-05-14 DIAGNOSIS — N3000 Acute cystitis without hematuria: Secondary | ICD-10-CM | POA: Diagnosis not present

## 2022-05-14 DIAGNOSIS — N3941 Urge incontinence: Secondary | ICD-10-CM | POA: Diagnosis not present

## 2022-05-14 DIAGNOSIS — N393 Stress incontinence (female) (male): Secondary | ICD-10-CM | POA: Diagnosis not present

## 2022-05-14 DIAGNOSIS — C61 Malignant neoplasm of prostate: Secondary | ICD-10-CM | POA: Diagnosis not present

## 2022-05-31 DIAGNOSIS — R197 Diarrhea, unspecified: Secondary | ICD-10-CM | POA: Diagnosis not present

## 2022-06-13 DIAGNOSIS — Z8546 Personal history of malignant neoplasm of prostate: Secondary | ICD-10-CM | POA: Diagnosis not present

## 2022-06-13 DIAGNOSIS — N3000 Acute cystitis without hematuria: Secondary | ICD-10-CM | POA: Diagnosis not present

## 2022-06-13 DIAGNOSIS — N393 Stress incontinence (female) (male): Secondary | ICD-10-CM | POA: Diagnosis not present

## 2022-06-18 DIAGNOSIS — N393 Stress incontinence (female) (male): Secondary | ICD-10-CM | POA: Diagnosis not present

## 2022-06-18 DIAGNOSIS — Z8546 Personal history of malignant neoplasm of prostate: Secondary | ICD-10-CM | POA: Diagnosis not present

## 2022-06-25 DIAGNOSIS — N393 Stress incontinence (female) (male): Secondary | ICD-10-CM | POA: Diagnosis not present

## 2022-06-25 DIAGNOSIS — N5231 Erectile dysfunction following radical prostatectomy: Secondary | ICD-10-CM | POA: Diagnosis not present

## 2022-06-25 DIAGNOSIS — Z8546 Personal history of malignant neoplasm of prostate: Secondary | ICD-10-CM | POA: Diagnosis not present

## 2022-09-11 DIAGNOSIS — R32 Unspecified urinary incontinence: Secondary | ICD-10-CM | POA: Diagnosis not present

## 2022-09-11 DIAGNOSIS — Z9079 Acquired absence of other genital organ(s): Secondary | ICD-10-CM | POA: Diagnosis not present

## 2022-09-11 DIAGNOSIS — N3281 Overactive bladder: Secondary | ICD-10-CM | POA: Diagnosis not present

## 2022-09-11 DIAGNOSIS — R300 Dysuria: Secondary | ICD-10-CM | POA: Diagnosis not present

## 2022-09-12 DIAGNOSIS — H401131 Primary open-angle glaucoma, bilateral, mild stage: Secondary | ICD-10-CM | POA: Diagnosis not present

## 2022-10-08 ENCOUNTER — Ambulatory Visit: Payer: BC Managed Care – PPO | Admitting: Internal Medicine

## 2022-10-08 ENCOUNTER — Encounter: Payer: Self-pay | Admitting: Internal Medicine

## 2022-10-08 ENCOUNTER — Other Ambulatory Visit: Payer: Self-pay

## 2022-10-08 VITALS — BP 128/80 | HR 54 | Temp 98.0°F | Resp 16 | Wt 237.8 lb

## 2022-10-08 DIAGNOSIS — J3089 Other allergic rhinitis: Secondary | ICD-10-CM

## 2022-10-08 DIAGNOSIS — J302 Other seasonal allergic rhinitis: Secondary | ICD-10-CM | POA: Diagnosis not present

## 2022-10-08 DIAGNOSIS — K200 Eosinophilic esophagitis: Secondary | ICD-10-CM | POA: Diagnosis not present

## 2022-10-08 MED ORDER — PANTOPRAZOLE SODIUM 40 MG PO TBEC: 40.0000 mg | DELAYED_RELEASE_TABLET | Freq: Two times a day (BID) | ORAL | 3 refills | Status: AC

## 2022-10-08 MED ORDER — AZELASTINE HCL 0.1 % NA SOLN: 1.0000 | Freq: Two times a day (BID) | NASAL | 5 refills | Status: AC | PRN

## 2022-10-08 MED ORDER — CETIRIZINE HCL 10 MG PO TABS: 10.0000 mg | ORAL_TABLET | Freq: Every day | ORAL | 5 refills | Status: AC | PRN

## 2022-10-08 NOTE — Patient Instructions (Addendum)
Allergic Rhinitis: - Positive skin test 01/2022: trees, grasses, weeds, mold, dust mite.  - Avoidance measures discussed. - Use nasal saline rinses before nose sprays such as with Neilmed Sinus Rinse.  Use distilled water.   - Use Azelastine 1-2 sprays each nostril twice daily as needed. Aim upward and outward. - Use Zyrtec 10 mg daily as needed for runny nose or itchy watery eyes.  - Consider allergy shots as long term control of your symptoms by teaching your immune system to be more tolerant of your allergy triggers  Eosinophilic Esophagitis - SPT 01/15/2022 positive to hazelnut and fish.  Unfortunately, food testing has limited diagnostic sensitivity/specificity for EoE. Some improvement with avoidance of fish and treenut so would continue avoidance of these foods.  - If symptoms worsen, can also try 2 food elimination diet with avoidance of all cow's milk and wheat products for 12 weeks.  - Please reschedule a follow up with GI. - Continue Protonix 40mg  twice daily.  - If symptoms worsen, consider Dupixent.

## 2022-10-08 NOTE — Progress Notes (Signed)
   FOLLOW UP Date of Service/Encounter:  10/08/22   Subjective:  Adam Ramos (DOB: 11-May-1960) is a 62 y.o. male who [facility] for follow up for allergic rhinoconjunctivitis and eosinophilic esophagitis.   History obtained from: chart review and patient. Last visit was with me on April 16, 2022 and at the time was doing well on twice daily Protonix and also Azelastine, Zyrtec as needed.  Also had informed him to follow-up with GI.  Since last visit, he reports his eosinophilic esophagitis has not really bothered him much.  He is not having trouble with food getting stuck, pain with swallowing, reflux, heartburn, vomiting.  Taking Protonix 40 mg twice daily.  He forgot to schedule his follow-up with GI.  Avoiding tree nuts and fish.  Does report a family history of EOE.  In terms of his rhinitis.  He is doing well.  Does sometimes have runny nose and drainage but it improves with blowing his nose.  Uses Azelastine and Zyrtec as needed.  Has not needed the eyedrops.  Past Medical History: Past Medical History:  Diagnosis Date   GERD (gastroesophageal reflux disease)    History of kidney stones    Nocturia    Prostate cancer [facility]) urologist--- dr Marlou Porch   s/p prostatectomy 05-17-2019   Urinary incontinence    Wears contact lenses     Objective:  BP 128/80   Pulse (!) 54   Temp 98 F (36.7 C) (Temporal)   Resp 16   Wt 237 lb 12.8 oz (107.9 kg)   SpO2 95%   BMI 32.25 kg/m  Body mass index is 32.25 kg/m. Physical Exam: GEN: alert, well developed HEENT: clear conjunctiva, TM grey and translucent, nose with mild inferior turbinate hypertrophy, pink nasal mucosa, +clear rhinorrhea, no cobblestoning HEART: regular rate and rhythm, no murmur LUNGS: clear to auscultation bilaterally, no coughing, unlabored respiration SKIN: no rashes or lesions  Assessment:   1. Seasonal and perennial allergic rhinitis   2. Eosinophilic esophagitis      Plan/Recommendations:   Allergic Rhinitis: - Well controlled  - Positive skin test 01/2022: trees, grasses, weeds, mold, dust mite.  - Avoidance measures discussed. - Use nasal saline rinses before nose sprays such as with Neilmed Sinus Rinse.  Use distilled water.   - Use Azelastine 1-2 sprays each nostril twice daily as needed. Aim upward and outward. - Use Zyrtec 10 mg daily as needed for runny nose or itchy watery eyes.  - Consider allergy shots as long term control of your symptoms by teaching your immune system to be more tolerant of your allergy triggers  Eosinophilic Esophagitis - Controlled on PPI.  - SPT 01/15/2022 positive to hazelnut and fish.  Unfortunately, food testing has limited diagnostic sensitivity/specificity for EoE. Some improvement with avoidance of fish and treenut so would continue avoidance of these foods.  - If symptoms worsen, can also try 2 food elimination diet with avoidance of all cow's milk and wheat products for 12 weeks.  - Please reschedule a follow up with GI. - Continue Protonix 40mg  twice daily.  - If symptoms worsen, consider Dupixent.      Return in about 1 year (around 10/08/2023).  Alesia Morin, MD [facility]

## 2022-10-30 DIAGNOSIS — K200 Eosinophilic esophagitis: Secondary | ICD-10-CM | POA: Diagnosis not present

## 2022-10-30 DIAGNOSIS — R609 Edema, unspecified: Secondary | ICD-10-CM | POA: Diagnosis not present

## 2022-10-30 DIAGNOSIS — R197 Diarrhea, unspecified: Secondary | ICD-10-CM | POA: Diagnosis not present

## 2022-10-30 DIAGNOSIS — Z0000 Encounter for general adult medical examination without abnormal findings: Secondary | ICD-10-CM | POA: Diagnosis not present

## 2022-12-10 DIAGNOSIS — R197 Diarrhea, unspecified: Secondary | ICD-10-CM | POA: Diagnosis not present

## 2022-12-27 DIAGNOSIS — K5730 Diverticulosis of large intestine without perforation or abscess without bleeding: Secondary | ICD-10-CM | POA: Diagnosis not present

## 2022-12-27 DIAGNOSIS — D123 Benign neoplasm of transverse colon: Secondary | ICD-10-CM | POA: Diagnosis not present

## 2022-12-27 DIAGNOSIS — K648 Other hemorrhoids: Secondary | ICD-10-CM | POA: Diagnosis not present

## 2022-12-27 DIAGNOSIS — K644 Residual hemorrhoidal skin tags: Secondary | ICD-10-CM | POA: Diagnosis not present

## 2022-12-27 DIAGNOSIS — R197 Diarrhea, unspecified: Secondary | ICD-10-CM | POA: Diagnosis not present

## 2022-12-30 DIAGNOSIS — Z8546 Personal history of malignant neoplasm of prostate: Secondary | ICD-10-CM | POA: Diagnosis not present

## 2023-01-06 DIAGNOSIS — N3946 Mixed incontinence: Secondary | ICD-10-CM | POA: Diagnosis not present

## 2023-01-06 DIAGNOSIS — Z8546 Personal history of malignant neoplasm of prostate: Secondary | ICD-10-CM | POA: Diagnosis not present

## 2023-01-06 DIAGNOSIS — N5231 Erectile dysfunction following radical prostatectomy: Secondary | ICD-10-CM | POA: Diagnosis not present

## 2023-01-14 DIAGNOSIS — Z860101 Personal history of adenomatous and serrated colon polyps: Secondary | ICD-10-CM | POA: Diagnosis not present

## 2023-01-14 DIAGNOSIS — K200 Eosinophilic esophagitis: Secondary | ICD-10-CM | POA: Diagnosis not present

## 2023-01-14 DIAGNOSIS — K529 Noninfective gastroenteritis and colitis, unspecified: Secondary | ICD-10-CM | POA: Diagnosis not present

## 2023-03-19 DIAGNOSIS — H401131 Primary open-angle glaucoma, bilateral, mild stage: Secondary | ICD-10-CM | POA: Diagnosis not present

## 2023-08-20 IMAGING — CT CT ENTEROGRAPHY (ABD-PELV W/ CM)
2 of 6 series · 15 of 46 positions shown, 17 images · IV contrast (APPLIED)
Comparison: CT June 17, 2021

CLINICAL DATA: Colonic diverticulosis without diverticulitis,
foreign body in digestive tract, initial encounter.

EXAM:
CT ABDOMEN AND PELVIS WITH CONTRAST (ENTEROGRAPHY)
TECHNIQUE: Multidetector CT of the abdomen and pelvis during bolus
administration of intravenous contrast. Negative oral contrast was
given.

[Series 3: entero thins · axial · 0.98mm/px · z∈[-277,+215]mm · 12 of 284 slices shown, 14 images]
[im 19/284  soft-tissue]
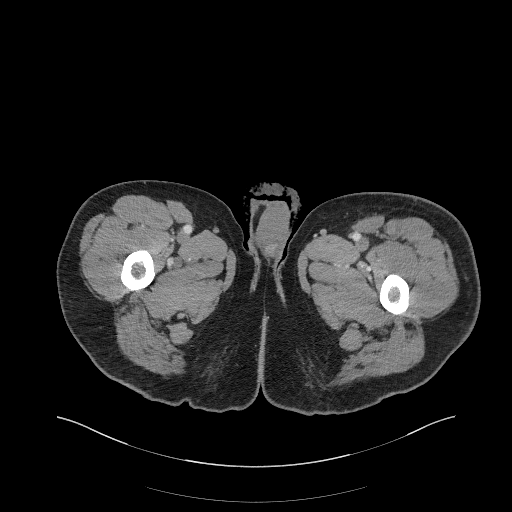
[im 19/284  bone]
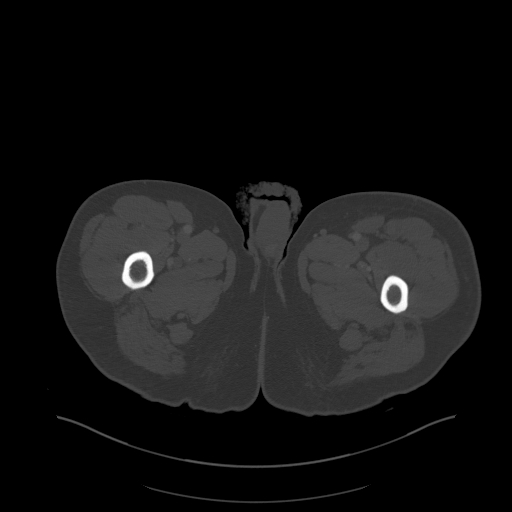
[im 38/284  soft-tissue]
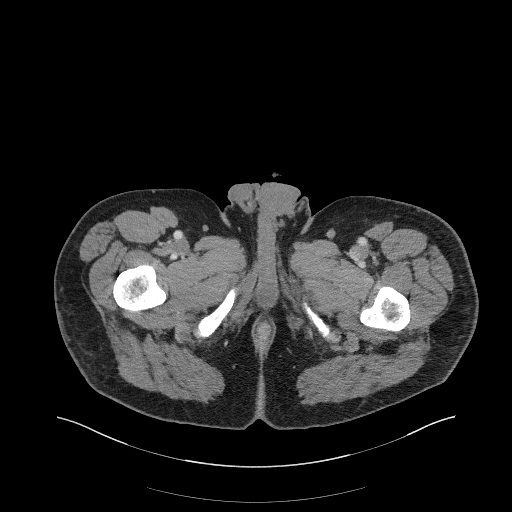
[im 57/284  soft-tissue]
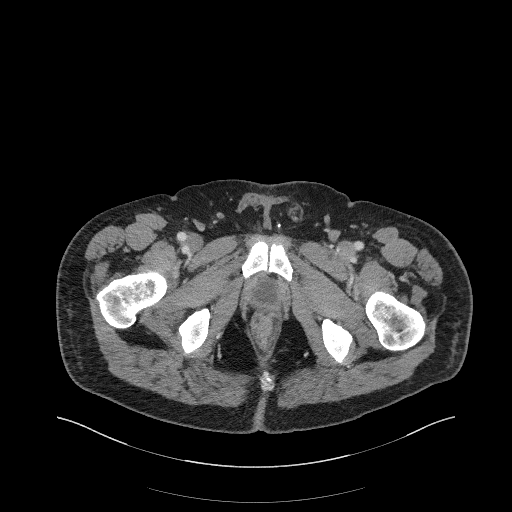
[im 95/284  soft-tissue]
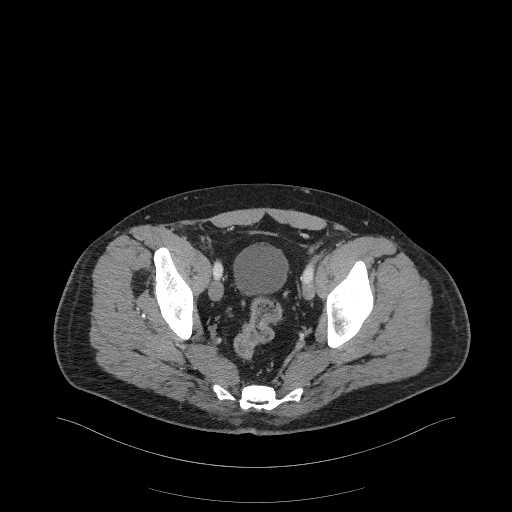
[im 114/284  soft-tissue]
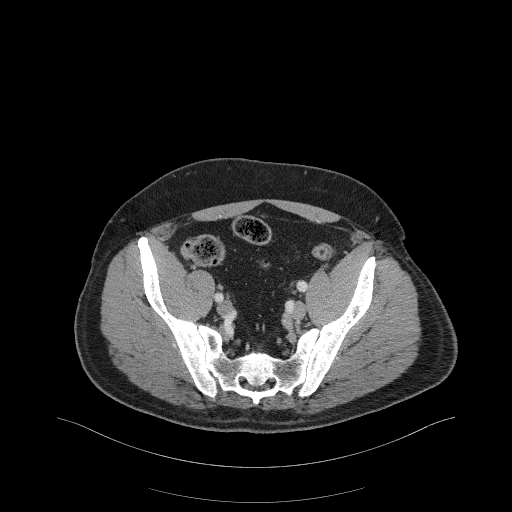
[im 133/284  soft-tissue]
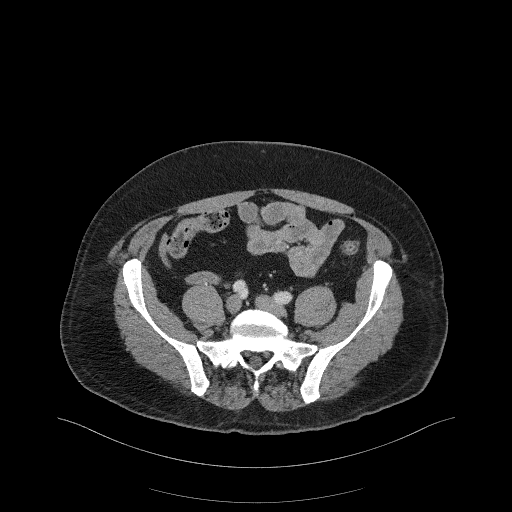
[im 151/284  soft-tissue]
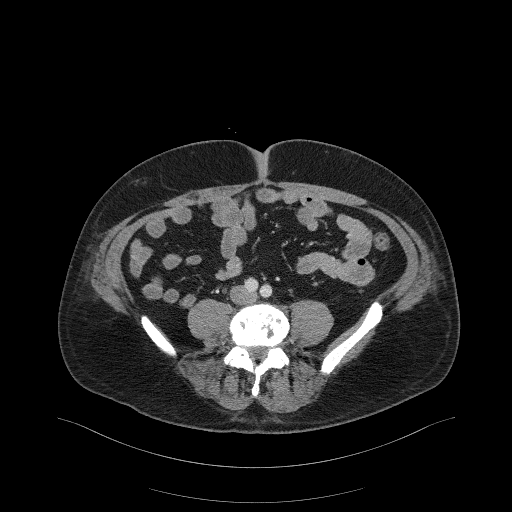
[im 170/284  soft-tissue]
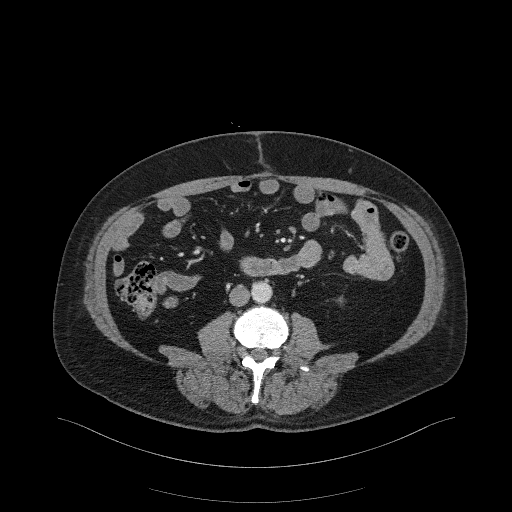
[im 189/284  soft-tissue]
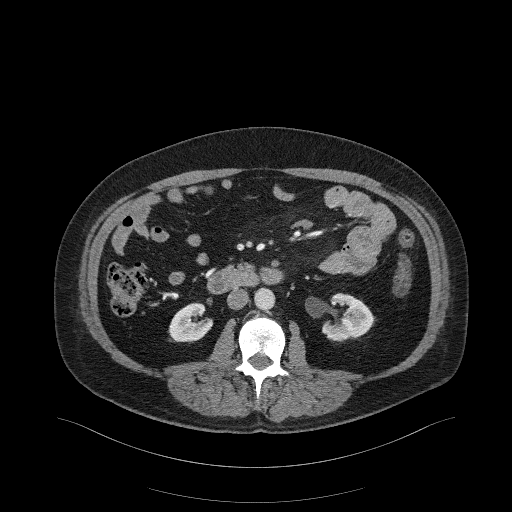
[im 189/284  bone]
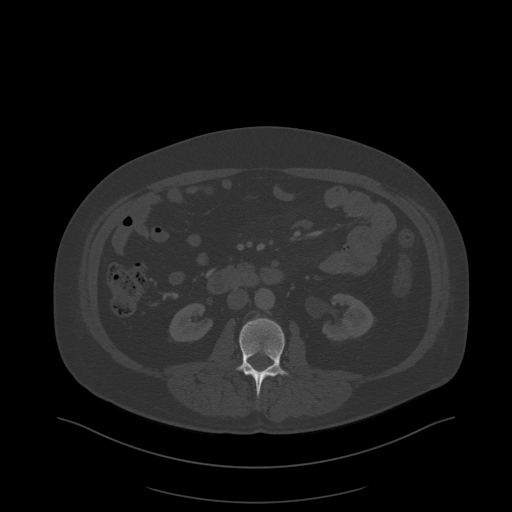
[im 227/284  soft-tissue]
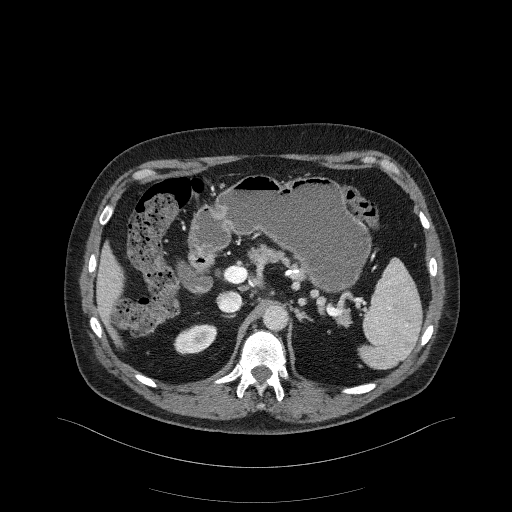
[im 246/284  soft-tissue]
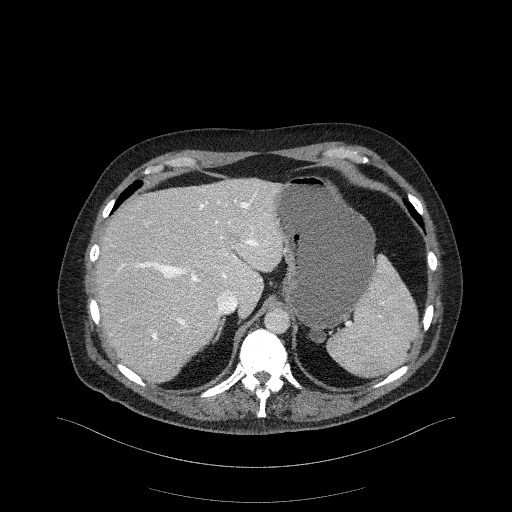
[im 265/284  soft-tissue]
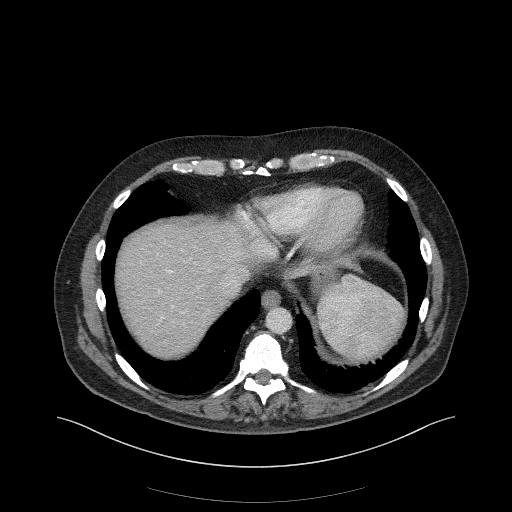

[Series 6: coronal · coronal · 0.96mm/px · 3 of 118 slices shown]
[im 40/118  soft-tissue]
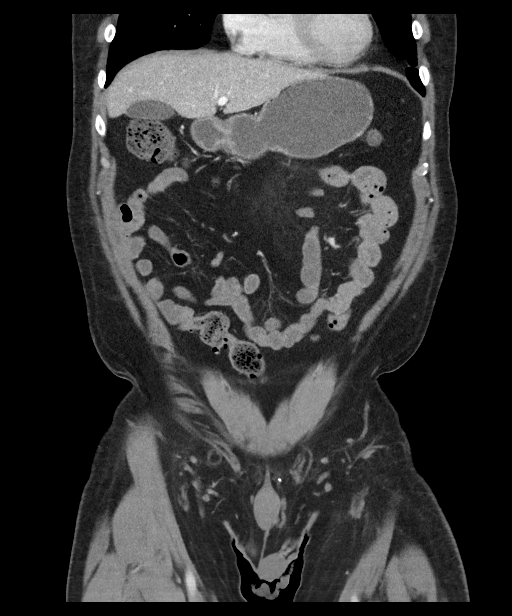
[im 53/118  soft-tissue]
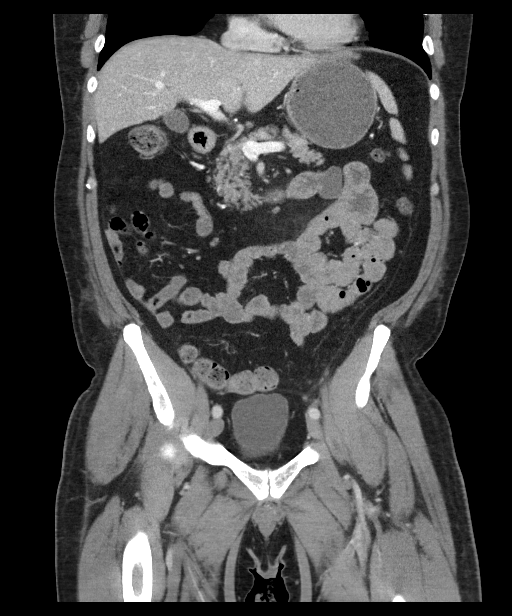
[im 66/118  soft-tissue]
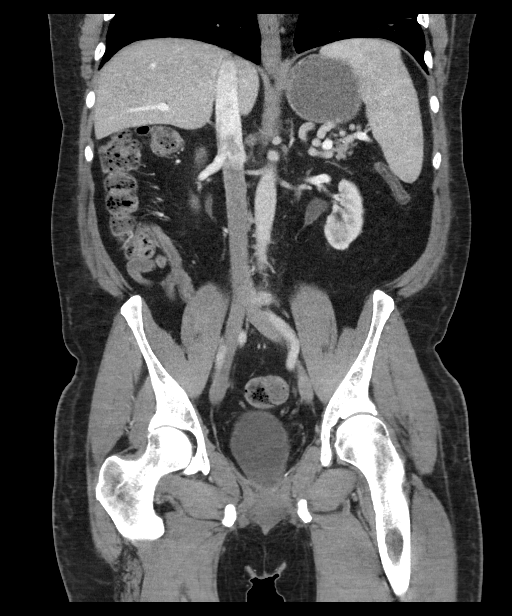

[15 of 46 positions shown; findings below may reference images not displayed]

RADIATION DOSE REDUCTION: This exam was performed according to the
departmental dose-optimization program which includes automated
exposure control, adjustment of the mA and/or kV according to
patient size and/or use of iterative reconstruction technique.

CONTRAST:  100mL OMNIPAQUE IOHEXOL 300 MG/ML  SOLN
FINDINGS: Lower chest:  No acute abnormality.

Hepatobiliary: No suspicious hepatic lesion. Gallbladder is
unremarkable. No biliary ductal dilation.

Pancreas: No pancreatic ductal dilation or evidence of acute
inflammation.

Spleen: No splenomegaly or focal splenic lesion.

Adrenals/Urinary Tract: Bilateral adrenal glands appear normal. No
hydronephrosis. Nonobstructive left renal calculi measure up to 7
mm. Kidneys demonstrate symmetric enhancement and excretion of
contrast material. Urinary bladder is unremarkable for degree of
distension.

Stomach/Bowel: Stomach is distended with a small posterior gastric
diverticulum. Mild fatty infiltration along the wall of the first
part of the duodenum for instance on coronal image 60/6 and 63/6. No
pathologic dilation of small or large bowel. The terminal ileum
appears normal. Appendix is not confidently identified however there
is no pericecal inflammation. Left-sided colonic diverticulosis
without findings of acute diverticulitis. No radiopaque foreign body
identified within the GI tract. Left hemicolon is predominantly
decompressed limiting evaluation. No evidence of acute bowel
inflammation. No radiopaque foreign body identified within the
gastrointestinal tract.

Vascular/Lymphatic: Normal caliber abdominal aorta. No
pathologically enlarged abdominal or pelvic lymph nodes.

Reproductive: Prostate gland appears surgically absent.

Other: Mild misty appearance of the small bowel mesentery with
prominent mesenteric lymph nodes measuring up to 3.3 mm.

Musculoskeletal: Multilevel degenerative changes spine. No acute
osseous abnormality.
IMPRESSION: 1. Left-sided colonic diverticulosis without findings of acute
diverticulitis.
2. No radiopaque foreign body identified within the GI tract and no
evidence of acute bowel inflammation.
3. Mild fatty infiltration along the wall of the first part of the
duodenum, which can be seen in the setting of chronic inflammation.
4. Mild misty appearance of the small bowel mesentery with prominent
mesenteric lymph nodes measuring up to 3.3 mm. Findings can be seen
in the setting of mesenteric panniculitis.
5. Nonobstructive left renal calculi measure up to 7 mm.

## 2023-09-25 ENCOUNTER — Ambulatory Visit
Admission: RE | Admit: 2023-09-25 | Discharge: 2023-09-25 | Disposition: A | Discharge status: Home / Self Care | Source: Ambulatory Visit | Attending: Physician Assistant | Admitting: Physician Assistant

## 2023-09-25 ENCOUNTER — Other Ambulatory Visit: Payer: Self-pay | Admitting: Physician Assistant

## 2023-09-25 DIAGNOSIS — R634 Abnormal weight loss: Secondary | ICD-10-CM

## 2023-10-07 ENCOUNTER — Ambulatory Visit: Payer: BC Managed Care – PPO | Admitting: Internal Medicine
